# Patient Record
Sex: Male | Born: 1960 | Race: White | Hispanic: Yes | Smoking: Never smoker
Health system: Southern US, Community
[De-identification: ages and names within clinical notes are randomized; demographics above are authoritative.]

## PROBLEM LIST (undated history)

## (undated) DIAGNOSIS — I472 Ventricular tachycardia: Secondary | ICD-10-CM

## (undated) DIAGNOSIS — I10 Essential (primary) hypertension: Secondary | ICD-10-CM

## (undated) DIAGNOSIS — Z952 Presence of prosthetic heart valve: Secondary | ICD-10-CM

## (undated) DIAGNOSIS — Z9889 Other specified postprocedural states: Secondary | ICD-10-CM

## (undated) DIAGNOSIS — I359 Nonrheumatic aortic valve disorder, unspecified: Secondary | ICD-10-CM

## (undated) DIAGNOSIS — K635 Polyp of colon: Secondary | ICD-10-CM

## (undated) DIAGNOSIS — I38 Endocarditis, valve unspecified: Secondary | ICD-10-CM

## (undated) DIAGNOSIS — K219 Gastro-esophageal reflux disease without esophagitis: Secondary | ICD-10-CM

## (undated) HISTORY — DX: Gastro-esophageal reflux disease without esophagitis: K21.9

## (undated) HISTORY — DX: Nonrheumatic aortic valve disorder, unspecified: I35.9

## (undated) HISTORY — DX: Ventricular tachycardia: I47.2

## (undated) HISTORY — DX: Essential (primary) hypertension: I10

## (undated) HISTORY — DX: Polyp of colon: K63.5

## (undated) HISTORY — DX: Endocarditis, valve unspecified: I38

## (undated) HISTORY — DX: Presence of prosthetic heart valve: Z95.2

## (undated) HISTORY — DX: Other specified postprocedural states: Z98.890

---

## 2001-06-19 ENCOUNTER — Emergency Department (HOSPITAL_COMMUNITY): Admission: EM | Admit: 2001-06-19 | Discharge: 2001-06-19 | Payer: Self-pay | Admitting: Emergency Medicine

## 2005-06-30 ENCOUNTER — Emergency Department (HOSPITAL_COMMUNITY): Admission: EM | Admit: 2005-06-30 | Discharge: 2005-06-30 | Payer: Self-pay | Admitting: Emergency Medicine

## 2005-09-11 DIAGNOSIS — Z952 Presence of prosthetic heart valve: Secondary | ICD-10-CM

## 2005-09-11 HISTORY — PX: AORTIC VALVE REPLACEMENT: SHX41

## 2005-09-11 HISTORY — DX: Presence of prosthetic heart valve: Z95.2

## 2006-01-20 ENCOUNTER — Ambulatory Visit: Payer: Self-pay | Admitting: Internal Medicine

## 2006-01-26 ENCOUNTER — Ambulatory Visit: Payer: Self-pay | Admitting: *Deleted

## 2006-01-26 ENCOUNTER — Ambulatory Visit (HOSPITAL_COMMUNITY): Admission: RE | Admit: 2006-01-26 | Discharge: 2006-01-26 | Payer: Self-pay | Admitting: Family Medicine

## 2006-01-29 ENCOUNTER — Ambulatory Visit: Payer: Self-pay

## 2006-01-30 ENCOUNTER — Ambulatory Visit: Payer: Self-pay | Admitting: *Deleted

## 2006-01-30 ENCOUNTER — Inpatient Hospital Stay (HOSPITAL_COMMUNITY): Admission: AD | Admit: 2006-01-30 | Discharge: 2006-02-11 | Payer: Self-pay | Admitting: *Deleted

## 2006-01-31 ENCOUNTER — Ambulatory Visit: Payer: Self-pay | Admitting: Dentistry

## 2006-01-31 ENCOUNTER — Encounter: Payer: Self-pay | Admitting: Internal Medicine

## 2006-02-01 ENCOUNTER — Encounter (INDEPENDENT_AMBULATORY_CARE_PROVIDER_SITE_OTHER): Payer: Self-pay | Admitting: *Deleted

## 2006-02-06 ENCOUNTER — Ambulatory Visit: Payer: Self-pay | Admitting: Internal Medicine

## 2006-02-06 ENCOUNTER — Encounter (INDEPENDENT_AMBULATORY_CARE_PROVIDER_SITE_OTHER): Payer: Self-pay | Admitting: Specialist

## 2006-02-17 ENCOUNTER — Emergency Department (HOSPITAL_COMMUNITY): Admission: EM | Admit: 2006-02-17 | Discharge: 2006-02-18 | Payer: Self-pay | Admitting: Emergency Medicine

## 2006-02-27 ENCOUNTER — Ambulatory Visit: Payer: Self-pay | Admitting: *Deleted

## 2006-02-28 ENCOUNTER — Ambulatory Visit: Payer: Self-pay | Admitting: Gastroenterology

## 2006-03-01 ENCOUNTER — Ambulatory Visit: Payer: Self-pay | Admitting: Gastroenterology

## 2006-03-15 ENCOUNTER — Emergency Department (HOSPITAL_COMMUNITY): Admission: EM | Admit: 2006-03-15 | Discharge: 2006-03-15 | Payer: Self-pay | Admitting: Emergency Medicine

## 2006-03-17 ENCOUNTER — Emergency Department (HOSPITAL_COMMUNITY): Admission: EM | Admit: 2006-03-17 | Discharge: 2006-03-17 | Payer: Self-pay | Admitting: Emergency Medicine

## 2006-03-29 ENCOUNTER — Ambulatory Visit: Payer: Self-pay | Admitting: *Deleted

## 2006-05-08 ENCOUNTER — Ambulatory Visit: Payer: Self-pay | Admitting: *Deleted

## 2006-05-09 ENCOUNTER — Ambulatory Visit: Payer: Self-pay | Admitting: *Deleted

## 2006-05-10 ENCOUNTER — Encounter: Payer: Self-pay | Admitting: Cardiovascular Disease

## 2006-05-10 ENCOUNTER — Ambulatory Visit: Payer: Self-pay

## 2006-05-10 ENCOUNTER — Ambulatory Visit: Payer: Self-pay | Admitting: *Deleted

## 2006-05-28 ENCOUNTER — Emergency Department (HOSPITAL_COMMUNITY): Admission: EM | Admit: 2006-05-28 | Discharge: 2006-05-28 | Payer: Self-pay | Admitting: Emergency Medicine

## 2006-07-17 ENCOUNTER — Ambulatory Visit: Payer: Self-pay | Admitting: *Deleted

## 2006-07-26 ENCOUNTER — Ambulatory Visit: Payer: Self-pay | Admitting: *Deleted

## 2006-08-15 IMAGING — CR DG CHEST 2V
2 series · 2 of 2 positions shown · non-contrast
Comparison: 02/17/06.

CLINICAL DATA: Aortic valve replacement.  Chest pain and burning.  
 CHEST ? 2 VIEW:

[view not recorded (1 of 2)]
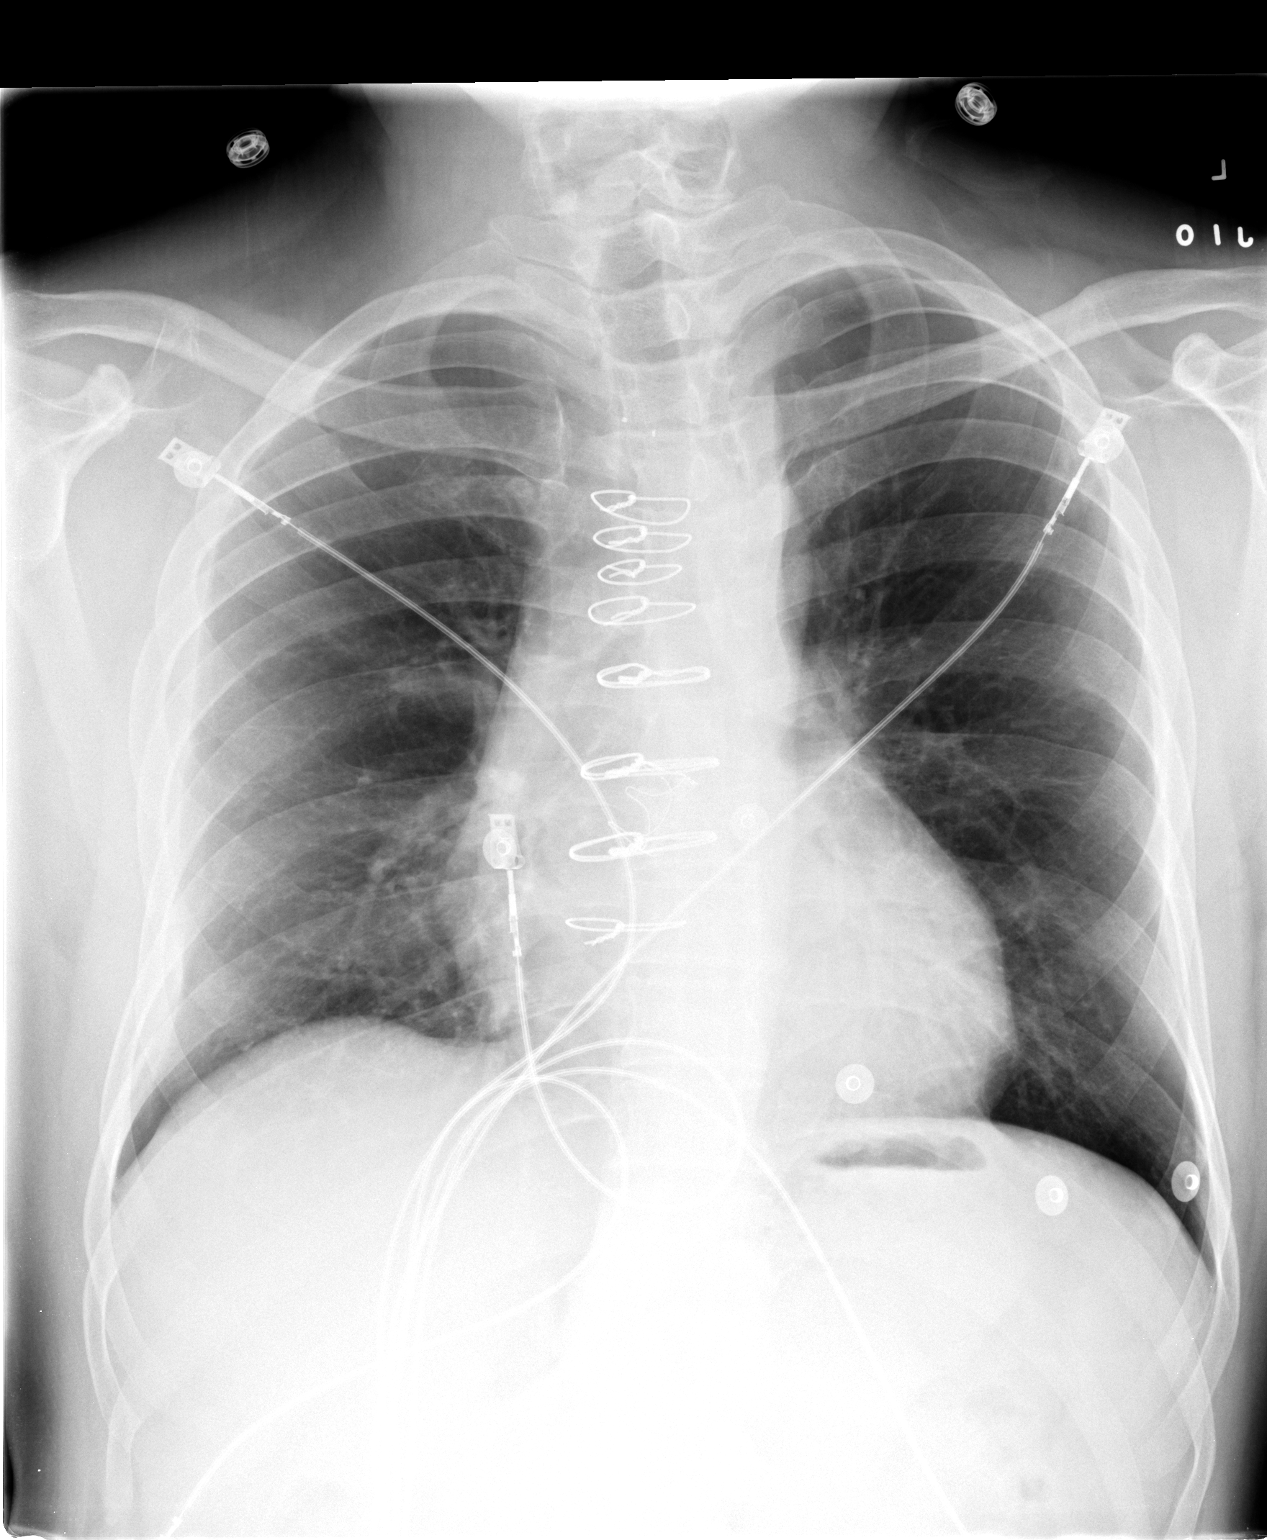

[view not recorded (2 of 2)]
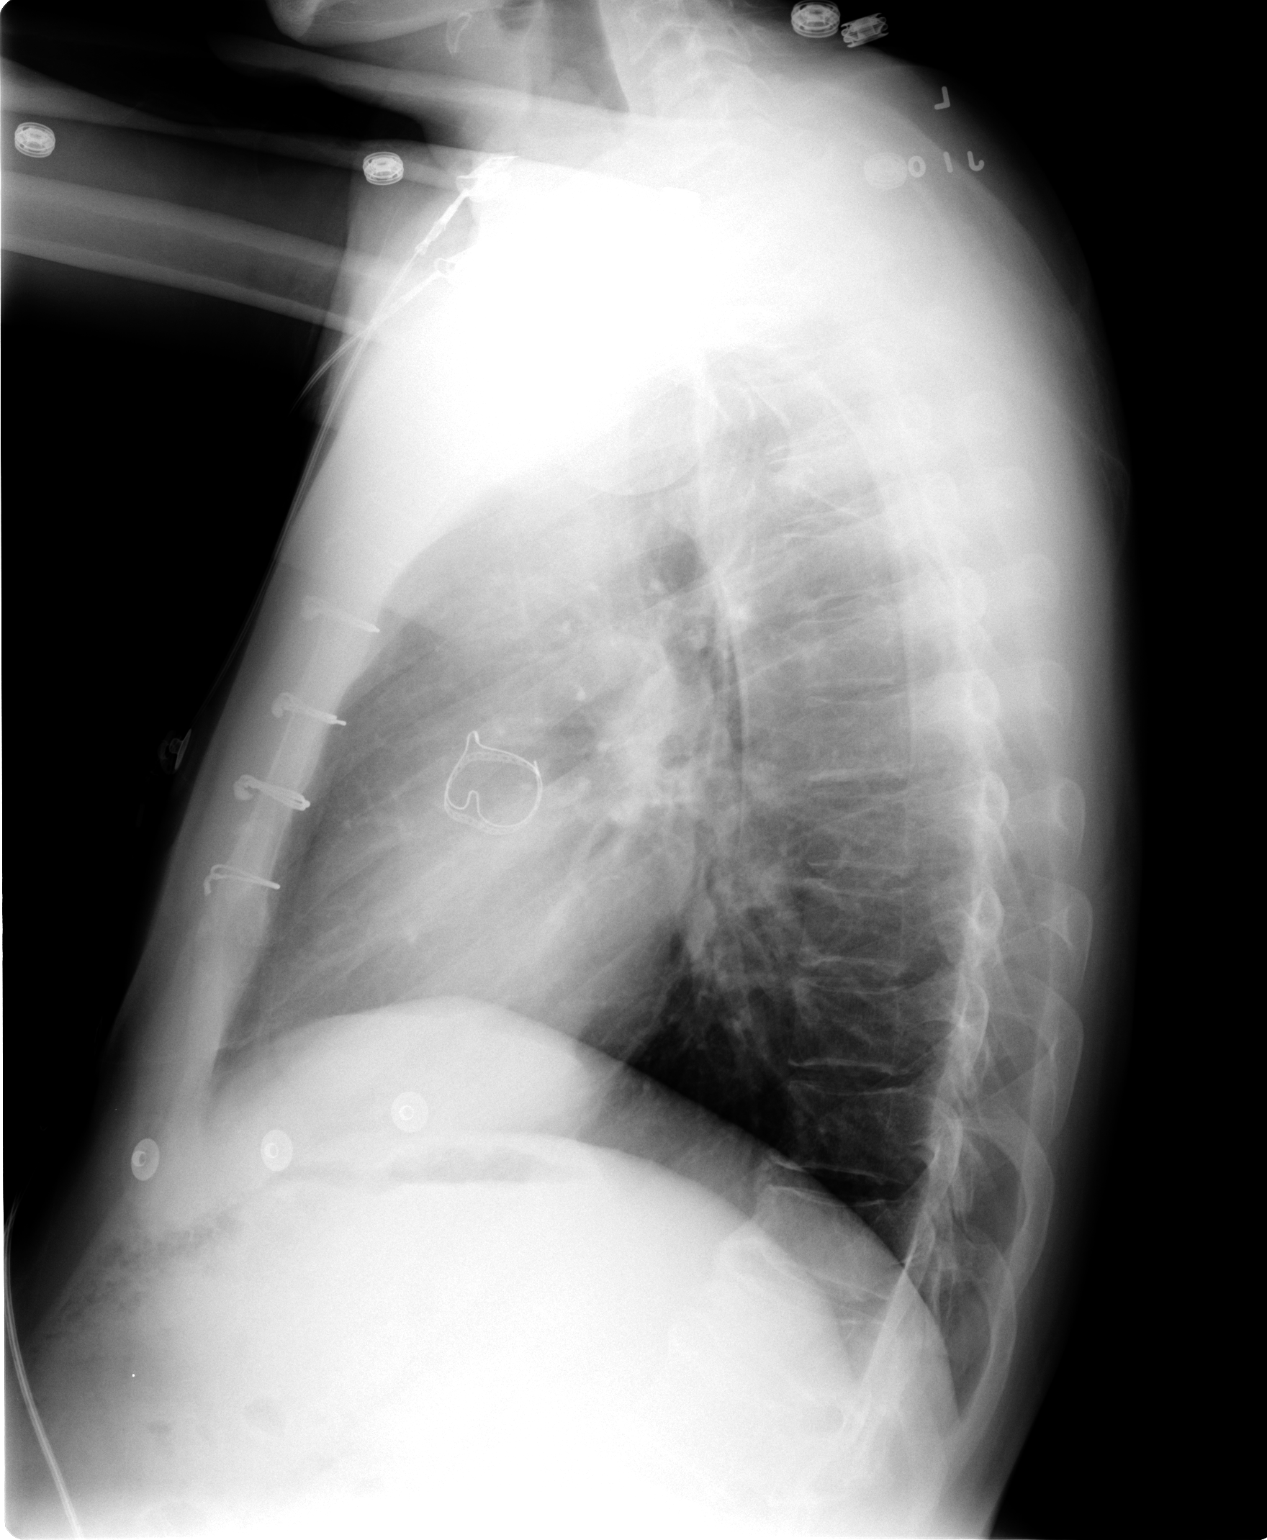

[2 of 2 positions shown; findings below may reference images not displayed]

FINDINGS: The patient has had previous median sternotomy and aortic valve replacement.  Heart size is within normal limits.  The vascularity is normal.  The lungs are clear.  No effusions.  No bony abnormality.
IMPRESSION: No active disease.

## 2006-08-22 ENCOUNTER — Ambulatory Visit: Payer: Self-pay | Admitting: Internal Medicine

## 2006-09-11 DIAGNOSIS — I472 Ventricular tachycardia, unspecified: Secondary | ICD-10-CM

## 2006-09-11 HISTORY — DX: Ventricular tachycardia: I47.2

## 2006-09-11 HISTORY — DX: Ventricular tachycardia, unspecified: I47.20

## 2006-10-01 ENCOUNTER — Ambulatory Visit: Payer: Self-pay | Admitting: *Deleted

## 2006-10-01 LAB — CONVERTED CEMR LAB
BUN: 9 mg/dL (ref 6–23)
CO2: 31 meq/L (ref 19–32)
Calcium: 9.8 mg/dL (ref 8.4–10.5)
Chloride: 102 meq/L (ref 96–112)
Cholesterol: 187 mg/dL (ref 0–200)
Creatinine, Ser: 0.8 mg/dL (ref 0.4–1.5)
GFR calc Af Amer: 135 mL/min
GFR calc non Af Amer: 112 mL/min
Glucose, Bld: 100 mg/dL — ABNORMAL HIGH (ref 70–99)
HDL: 43.9 mg/dL (ref >39.0)
LDL Cholesterol: 118 mg/dL — ABNORMAL HIGH (ref 0–99)
Potassium: 3.8 meq/L (ref 3.5–5.1)
Sodium: 139 meq/L (ref 135–145)
Total CHOL/HDL Ratio: 4.3
Triglycerides: 126 mg/dL (ref 0–149)
VLDL: 25 mg/dL (ref 0–40)

## 2006-10-21 ENCOUNTER — Emergency Department (HOSPITAL_COMMUNITY): Admission: EM | Admit: 2006-10-21 | Discharge: 2006-10-21 | Payer: Self-pay | Admitting: Emergency Medicine

## 2006-12-26 ENCOUNTER — Ambulatory Visit: Payer: Self-pay | Admitting: *Deleted

## 2007-01-11 ENCOUNTER — Ambulatory Visit: Payer: Self-pay | Admitting: Internal Medicine

## 2007-01-11 ENCOUNTER — Ambulatory Visit: Payer: Self-pay | Admitting: *Deleted

## 2007-01-11 LAB — CONVERTED CEMR LAB
BUN: 16 mg/dL (ref 6–23)
CO2: 30 meq/L (ref 19–32)
Calcium: 9.8 mg/dL (ref 8.4–10.5)
Chloride: 106 meq/L (ref 96–112)
Creatinine, Ser: 0.8 mg/dL (ref 0.4–1.5)
GFR calc Af Amer: 135 mL/min
GFR calc non Af Amer: 112 mL/min
Glucose, Bld: 91 mg/dL (ref 70–99)
Potassium: 4.4 meq/L (ref 3.5–5.1)
Sodium: 142 meq/L (ref 135–145)

## 2007-02-08 ENCOUNTER — Ambulatory Visit: Payer: Self-pay | Admitting: *Deleted

## 2007-02-21 ENCOUNTER — Ambulatory Visit: Payer: Self-pay

## 2007-02-21 ENCOUNTER — Encounter (INDEPENDENT_AMBULATORY_CARE_PROVIDER_SITE_OTHER): Payer: Self-pay | Admitting: *Deleted

## 2007-04-25 ENCOUNTER — Ambulatory Visit: Payer: Self-pay | Admitting: Cardiovascular Disease

## 2007-04-25 ENCOUNTER — Ambulatory Visit: Payer: Self-pay

## 2007-04-25 LAB — CONVERTED CEMR LAB
ALT: 35 units/L (ref 0–53)
AST: 31 units/L (ref 0–37)
Albumin: 4.3 g/dL (ref 3.5–5.2)
Alkaline Phosphatase: 75 units/L (ref 39–117)
BUN: 12 mg/dL (ref 6–23)
Bilirubin, Direct: 0.2 mg/dL (ref 0.0–0.3)
CO2: 29 meq/L (ref 19–32)
Calcium: 9.4 mg/dL (ref 8.4–10.5)
Chloride: 106 meq/L (ref 96–112)
Cholesterol: 180 mg/dL (ref 0–200)
Creatinine, Ser: 0.7 mg/dL (ref 0.4–1.5)
GFR calc Af Amer: 157 mL/min
GFR calc non Af Amer: 130 mL/min
Glucose, Bld: 90 mg/dL (ref 70–99)
HDL: 37.4 mg/dL — ABNORMAL LOW (ref >39.0)
Hgb A1c MFr Bld: 5.2 % (ref 4.6–6.0)
LDL Cholesterol: 123 mg/dL — ABNORMAL HIGH (ref 0–99)
Potassium: 4 meq/L (ref 3.5–5.1)
Sodium: 141 meq/L (ref 135–145)
Total Bilirubin: 2.5 mg/dL — ABNORMAL HIGH (ref 0.3–1.2)
Total CHOL/HDL Ratio: 4.8
Total Protein: 7.2 g/dL (ref 6.0–8.3)
Triglycerides: 99 mg/dL (ref 0–149)
VLDL: 20 mg/dL (ref 0–40)

## 2007-10-17 ENCOUNTER — Ambulatory Visit: Payer: Self-pay | Admitting: Cardiovascular Disease

## 2007-12-16 ENCOUNTER — Ambulatory Visit: Payer: Self-pay | Admitting: Cardiovascular Disease

## 2007-12-25 ENCOUNTER — Encounter: Payer: Self-pay | Admitting: Cardiovascular Disease

## 2007-12-25 ENCOUNTER — Ambulatory Visit: Payer: Self-pay

## 2008-02-04 ENCOUNTER — Ambulatory Visit (HOSPITAL_COMMUNITY): Admission: RE | Admit: 2008-02-04 | Discharge: 2008-02-04 | Payer: Self-pay | Admitting: Cardiovascular Disease

## 2008-02-04 ENCOUNTER — Ambulatory Visit: Payer: Self-pay | Admitting: Cardiovascular Disease

## 2008-04-28 ENCOUNTER — Emergency Department (HOSPITAL_COMMUNITY): Admission: EM | Admit: 2008-04-28 | Discharge: 2008-04-28 | Payer: Self-pay | Admitting: Emergency Medicine

## 2008-06-26 ENCOUNTER — Ambulatory Visit: Payer: Self-pay | Admitting: Cardiovascular Disease

## 2008-10-15 ENCOUNTER — Emergency Department (HOSPITAL_COMMUNITY): Admission: EM | Admit: 2008-10-15 | Discharge: 2008-10-15 | Payer: Self-pay | Admitting: Emergency Medicine

## 2008-11-05 ENCOUNTER — Encounter: Payer: Self-pay | Admitting: Cardiovascular Disease

## 2008-11-05 ENCOUNTER — Ambulatory Visit: Payer: Self-pay | Admitting: Cardiovascular Disease

## 2008-11-05 DIAGNOSIS — Z952 Presence of prosthetic heart valve: Secondary | ICD-10-CM | POA: Insufficient documentation

## 2008-11-05 DIAGNOSIS — I38 Endocarditis, valve unspecified: Secondary | ICD-10-CM | POA: Insufficient documentation

## 2008-11-05 DIAGNOSIS — I509 Heart failure, unspecified: Secondary | ICD-10-CM

## 2008-11-05 DIAGNOSIS — I472 Ventricular tachycardia: Secondary | ICD-10-CM

## 2008-11-05 DIAGNOSIS — I10 Essential (primary) hypertension: Secondary | ICD-10-CM | POA: Insufficient documentation

## 2008-11-05 DIAGNOSIS — R079 Chest pain, unspecified: Secondary | ICD-10-CM

## 2008-11-05 DIAGNOSIS — K219 Gastro-esophageal reflux disease without esophagitis: Secondary | ICD-10-CM | POA: Insufficient documentation

## 2008-11-05 DIAGNOSIS — I359 Nonrheumatic aortic valve disorder, unspecified: Secondary | ICD-10-CM | POA: Insufficient documentation

## 2009-04-05 ENCOUNTER — Emergency Department (HOSPITAL_COMMUNITY): Admission: EM | Admit: 2009-04-05 | Discharge: 2009-04-06 | Payer: Self-pay | Admitting: Emergency Medicine

## 2009-05-14 ENCOUNTER — Ambulatory Visit: Payer: Self-pay | Admitting: Cardiovascular Disease

## 2009-06-18 ENCOUNTER — Telehealth (INDEPENDENT_AMBULATORY_CARE_PROVIDER_SITE_OTHER): Payer: Self-pay | Admitting: *Deleted

## 2009-10-05 ENCOUNTER — Ambulatory Visit: Payer: Self-pay | Admitting: Cardiovascular Disease

## 2009-10-05 DIAGNOSIS — R42 Dizziness and giddiness: Secondary | ICD-10-CM | POA: Insufficient documentation

## 2009-10-06 ENCOUNTER — Telehealth: Payer: Self-pay | Admitting: Cardiovascular Disease

## 2009-10-06 ENCOUNTER — Encounter (INDEPENDENT_AMBULATORY_CARE_PROVIDER_SITE_OTHER): Payer: Self-pay | Admitting: *Deleted

## 2009-12-13 ENCOUNTER — Ambulatory Visit (HOSPITAL_COMMUNITY): Admission: RE | Admit: 2009-12-13 | Discharge: 2009-12-13 | Payer: Self-pay | Admitting: Cardiovascular Disease

## 2009-12-13 ENCOUNTER — Ambulatory Visit: Payer: Self-pay | Admitting: Internal Medicine

## 2009-12-13 ENCOUNTER — Ambulatory Visit: Payer: Self-pay

## 2009-12-13 ENCOUNTER — Encounter: Payer: Self-pay | Admitting: Cardiovascular Disease

## 2009-12-14 ENCOUNTER — Telehealth (INDEPENDENT_AMBULATORY_CARE_PROVIDER_SITE_OTHER): Payer: Self-pay | Admitting: *Deleted

## 2010-02-15 ENCOUNTER — Emergency Department (HOSPITAL_COMMUNITY): Admission: EM | Admit: 2010-02-15 | Discharge: 2010-02-16 | Payer: Self-pay | Admitting: Emergency Medicine

## 2010-02-17 ENCOUNTER — Telehealth: Payer: Self-pay | Admitting: Cardiovascular Disease

## 2010-03-11 ENCOUNTER — Ambulatory Visit: Payer: Self-pay | Admitting: Cardiovascular Disease

## 2010-04-24 ENCOUNTER — Emergency Department (HOSPITAL_COMMUNITY): Admission: EM | Admit: 2010-04-24 | Discharge: 2010-04-24 | Payer: Self-pay | Admitting: Emergency Medicine

## 2010-06-22 ENCOUNTER — Emergency Department (HOSPITAL_COMMUNITY): Admission: EM | Admit: 2010-06-22 | Discharge: 2010-06-22 | Payer: Self-pay | Admitting: Emergency Medicine

## 2010-08-06 ENCOUNTER — Emergency Department (HOSPITAL_COMMUNITY): Admission: EM | Admit: 2010-08-06 | Discharge: 2010-08-06 | Payer: Self-pay | Admitting: Emergency Medicine

## 2010-09-15 ENCOUNTER — Encounter: Payer: Self-pay | Admitting: Cardiovascular Disease

## 2010-09-15 ENCOUNTER — Ambulatory Visit
Admission: RE | Admit: 2010-09-15 | Discharge: 2010-09-15 | Payer: Self-pay | Source: Home / Self Care | Attending: Cardiovascular Disease | Admitting: Cardiovascular Disease

## 2010-10-11 NOTE — Progress Notes (Signed)
  Recieved Request from [facility] forwarded to [place].[name]  December 14, 2009 2:25 PM

## 2010-10-11 NOTE — Letter (Signed)
Summary: Return To Work  [employer], Main Office  [address]   [place], [place] [address]   Phone: [phone]  Fax: [phone]    10/06/2009  TO: Leodis Sias IT MAY CONCERN   RE: [name] [address] [id number]   The above named individual is under my medical care and may return to work on: UNTIL HE SEES NEUROLOGIST APPT PENIDNG   If you have any further questions or need additional information, please call.     Sincerely,    DR COOPER/Daemian Gahm, LPN

## 2010-10-11 NOTE — Progress Notes (Signed)
Summary: need letter fax about returning to work.  Phone Note Call from Patient Call back at Home Phone [phone]   Caller: Patient Summary of Call: Pt calling regarding getting letter faxed saying when the pt can return back to work fax to social services [phone] Initial call taken by: Judie Grieve,  October 06, 2009 11:35 AM  Follow-up for Phone Call        PER DR Cherae Marton NOT ABLE TO RETURN TO WORK  UNTIL HE SEES NEUROLOGIST APPT PENDING  Follow-up by: Scherrie Bateman, LPN,  October 06, 2009 12:03 PM  Additional Follow-up for Phone Call Additional follow up Details #1::        Pt very symptomatic with dizziness. Needs to see neurologist before returning to work. Additional Follow-up by: Norva Karvonen, MD,  October 07, 2009 5:40 PM

## 2010-10-11 NOTE — Assessment & Plan Note (Signed)
Summary: ROV   Visit Type:  Follow-up Primary Provider:  Dr Uvaldo Rising  CC:  Legs and arms weakness- Left shoulder and back pain- chest pains- dizy.  History of Present Illness: the patient is a 50 year old gentleman with a history of bicuspid aortic valve who underwent bioprosthetic AVR in 2007 after he developed endocarditis. He has had multiple symptomatic complaints since surgery and has undergone extensive evaluation for chest pain including echocardiography, myocardial perfusion scanning, and coronary angiography. His cardiac catheterization in May 2009 demonstrated essentially normal coronary arteries.  He was again evaluated in the Emergency Dept at [facility] for chest pain in December 2010 and was told not to return to work until further evaluation. The patient continues to have episodic, highly atypical chest pain. He has focal, sharp chest pains in his left chest. These are nonexertional. He also has problems with lightheadedness/dizziness. These symptoms are unrelated to positional changes, and have abrupt onset. He has also been evaluated for this in the past with outpatient monitoring which has been unrevealing.   Current Medications (verified): 1)  Aspirin 81 Mg Tbec (Aspirin) .... Take One Tablet By Mouth Daily 2)  Multivitamins  Tabs (Multiple Vitamin) .... Take 1 Tablet By Mouth Once A Day 3)  Caltrate 600 1500 Mg Tabs (Calcium Carbonate) .... Take 1 Tablet By Mouth Once A Day 4)  Metamucil 0.52 Gm Caps (Psyllium) .... Take 2 Capsules Once Daily 5)  Metoprolol Tartrate 25 Mg Tabs (Metoprolol Tartrate) .... 1/2 Tablet Am Ans 1 Tablet Pm 6)  Fish Oil 1000 Mg Caps (Omega-3 Fatty Acids) .... Take 2 Capsules By Mouth Once Daily 7)  Lisinopril 10 Mg Tabs (Lisinopril) .... Take One Tablet By Mouth Daily 8)  Niacin 100 Mg Tabs (Niacin) .... Take 1 Tablet By Mouth Once A Day 9)  Selenium 200 Mcg Caps (Selenium) .... Take 1 Tablet By Mouth Once A Day 10)  Amoxicillin 500 Mg Tabs  (Amoxicillin) .... Take 4 Tablets By Mouth 1 Hour Prior To Dental Work As Needed 11)  Nitroglycerin 0.4 Mg Subl (Nitroglycerin) .... One Tablet Under Tongue Every 5 Minutes As Needed For Chest Pain---May Repeat Times Three 12)  Cyclobenzaprine Hcl 10 Mg Tabs (Cyclobenzaprine Hcl) .... Take 1 Tablet By Mouth Three Times A Day 13)  Vitamin C 500 Mg  Tabs (Ascorbic Acid) .... Take 1 Tablet By Mouth Once A Day 14)  Garlic Oil 1000 Mg Caps (Garlic) .... Take 1 Capsule By Mouth Once A Day 15)  Clonazepam 0.5 Mg Tabs (Clonazepam) .... Take 1 Tablet By Mouth Once A Day At Bedtime 16)  Omeprazole 20 Mg Tbec (Omeprazole) .... Take 1 Tablet By Mouth Once A Day 17)  Neurontin 300 Mg Caps (Gabapentin) .... 2-3 X A Day 18)  Tramadol Hcl 50 Mg Tabs (Tramadol Hcl) .... As Needed 19)  Loratadine 10 Mg Tabs (Loratadine) .... Take 1 Tablet By Mouth Once A Day 20)  Fluticasone Propionate 50 Mcg/act Susp (Fluticasone Propionate) .... 2 Sprays Each Nostril 21)  Milk Thistle 500 Mg Caps (Milk Thistle) .... Take 1 Capsule By Mouth Two Times A Day  Allergies: 1)  ! Ibuprofen 2)  ! Hydrocodone 3)  ! * Acetaminophen  Past History:  Past medical history reviewed for relevance to current acute and chronic problems.  Past Medical History: Reviewed history from 11/05/2008 and no changes required. VENTRICULAR TACHYCARDIA (ICD-427.1) ENDOCARDITIS (ICD-424.90) AORTIC VALVE DISORDERS (ICD-424.1) CONGESTIVE HEART FAILURE UNSPECIFIED (ICD-428.0) AORTIC VALVE REPLACEMENT, HX OF (ICD-V43.3) GERD (ICD-530.81) CAD, ARTERY BYPASS GRAFT (ICD-414.04)  HYPERTENSION, BENIGN (ICD-401.1)  Review of Systems       Positive for back pain, leg pain, palpitations, dyspnea, headache, and otherwise negative except as per HPI.  Vital Signs:  Patient profile:   50 year old male Height:      63 inches Weight:      155.75 pounds BMI:     27.69 Pulse rate:   65 / minute Pulse rhythm:   regular Resp:     18 per minute BP sitting:    94 / 70  (left arm) Cuff size:   large  Vitals Entered By: Vikki Ports (October 05, 2009 9:20 AM)  Physical Exam  General:  Pt is alert and oriented, in no acute distress. HEENT: normal Neck: normal carotid upstrokes without bruits, JVP normal Lungs: CTA CV: RRR with 2/6 systolic murmur at the LSB Abd: soft, NT, positive BS, no bruit, no organomegaly Ext: no clubbing, cyanosis, or edema. peripheral pulses 2+ and equal Skin: warm and dry without rash    EKG  Procedure date:  10/05/2009  Findings:      NSR with RBBB and left axis deviation, unchanged from previous, HR 65 bpm.  Impression & Recommendations:  Problem # 1:  CHEST PAIN-UNSPECIFIED (ICD-786.50) Highly atypical - evaluated by cardiac cath and stress testing in past which have been normal.  His updated medication list for this problem includes:    Aspirin 81 Mg Tbec (Aspirin) .Marland Kitchen... Take one tablet by mouth daily    Metoprolol Tartrate 25 Mg Tabs (Metoprolol tartrate) .Marland Kitchen... 1/2 tablet am ans 1 tablet pm    Lisinopril 10 Mg Tabs (Lisinopril) .Marland Kitchen... Take one tablet by mouth daily    Nitroglycerin 0.4 Mg Subl (Nitroglycerin) ..... One tablet under tongue every 5 minutes as needed for chest pain---may repeat times three  Problem # 2:  AORTIC VALVE DISORDERS (ICD-424.1) Pt is s/p AVR and his exam is stable. Follow up echo has shown appropriate function of his aortic valve bioprosthesis.  His updated medication list for this problem includes:    Metoprolol Tartrate 25 Mg Tabs (Metoprolol tartrate) .Marland Kitchen... 1/2 tablet am ans 1 tablet pm    Lisinopril 10 Mg Tabs (Lisinopril) .Marland Kitchen... Take one tablet by mouth daily    Nitroglycerin 0.4 Mg Subl (Nitroglycerin) ..... One tablet under tongue every 5 minutes as needed for chest pain---may repeat times three  Problem # 3:  HYPERTENSION, BENIGN (ICD-401.1) BP at goal on current Rx.  His updated medication list for this problem includes:    Aspirin 81 Mg Tbec (Aspirin) .Marland Kitchen... Take  one tablet by mouth daily    Metoprolol Tartrate 25 Mg Tabs (Metoprolol tartrate) .Marland Kitchen... 1/2 tablet am ans 1 tablet pm    Lisinopril 10 Mg Tabs (Lisinopril) .Marland Kitchen... Take one tablet by mouth daily  Orders: EKG w/ Interpretation (93000)  BP today: 94/70 Prior BP: 115/78 (05/14/2009)  Labs Reviewed: K+: 4.0 (04/25/2007) Creat: : 0.7 (04/25/2007)   Chol: 180 (04/25/2007)   HDL: 37.4 (04/25/2007)   LDL: 123 (04/25/2007)   TG: 99 (04/25/2007)  Problem # 4:  DIZZINESS (ICD-780.4) Unable to identify a cardiac etiology. I spoke with his primary physician at the [facility] Dept, Dr Uvaldo Rising, who will arrange outpatient neurology evaluation.  Patient Instructions: 1)  Your physician recommends that you continue on your current medications as directed. Please refer to the Current Medication list given to you today. 2)  Your physician wants you to follow-up in:  6 MONTHS.  You will receive a  reminder letter in the mail two months in advance. If you don't receive a letter, please call our office to schedule the follow-up appointment.

## 2010-10-11 NOTE — Miscellaneous (Signed)
Summary: Orders Update  Clinical Lists Changes  Orders: Added new Referral order of Echocardiogram (Echo) - Signed 

## 2010-10-11 NOTE — Assessment & Plan Note (Signed)
Summary: f71m   Visit Type:  6 months follow up Primary Provider:  Dr Uvaldo Rising  CC:  Dizziness-chest pains-sweats without no reason-legs pain.  History of Present Illness: the patient is a 50 year old gentleman with a history of bicuspid aortic valve who underwent bioprosthetic AVR in 2007 after he developed endocarditis. He has had multiple symptomatic complaints since surgery and has undergone extensive evaluation for chest pain including echocardiography, myocardial perfusion scanning, and coronary angiography. His cardiac catheterization in May 2009 demonstrated essentially normal coronary arteries.  He complains of multiple symptoms - continue dizziness - this is frequent and longstanding. Also c/o chest pain - pins and needles sensation. Other complaints include leg pain, back pain, shortness of breath, and anxiety.  Current Medications (verified): 1)  Aspirin 81 Mg Tbec (Aspirin) .... Take One Tablet By Mouth Daily 2)  Multivitamins  Tabs (Multiple Vitamin) .... Take 1 Tablet By Mouth Once A Day 3)  Caltrate 600 1500 Mg Tabs (Calcium Carbonate) .... Take 1 Tablet By Mouth Once A Day 4)  Metamucil 0.52 Gm Caps (Psyllium) .... Take 2 Capsules Once Daily 5)  Metoprolol Tartrate 25 Mg Tabs (Metoprolol Tartrate) .Marland Kitchen.. 1 Tablet Am and 1/2 Tablet Pm 6)  Fish Oil 1000 Mg Caps (Omega-3 Fatty Acids) .... Take 2 Capsules By Mouth Once Daily 7)  Lisinopril 10 Mg Tabs (Lisinopril) .... Take One Tablet By Mouth Daily 8)  Niacin 100 Mg Tabs (Niacin) .... Take 1 Tablet By Mouth Once A Day 9)  Selenium 200 Mcg Caps (Selenium) .... Take 1 Tablet By Mouth Once A Day 10)  Amoxicillin 500 Mg Tabs (Amoxicillin) .... Take 4 Tablets By Mouth 1 Hour Prior To Dental Work As Needed 11)  Nitroglycerin 0.4 Mg Subl (Nitroglycerin) .... One Tablet Under Tongue Every 5 Minutes As Needed For Chest Pain---May Repeat Times Three 12)  Cyclobenzaprine Hcl 10 Mg Tabs (Cyclobenzaprine Hcl) .... As Needed 13)  Garlic Oil 1000  Mg Caps (Garlic) .... Take 1 Capsule By Mouth Once A Day 14)  Clonazepam 0.5 Mg Tabs (Clonazepam) .... Take 1 Tablet By Mouth Once A Day At Bedtime 15)  Omeprazole 20 Mg Tbec (Omeprazole) .... Take 1 Tablet By Mouth Once A Day 16)  Neurontin 300 Mg Caps (Gabapentin) .... As Needed 17)  Tramadol Hcl 50 Mg Tabs (Tramadol Hcl) .... As Needed 18)  Loratadine 10 Mg Tabs (Loratadine) .... Take 1 Tablet By Mouth Once A Day 19)  Fluticasone Propionate 50 Mcg/act Susp (Fluticasone Propionate) .... 2 Sprays Each Nostril 20)  Milk Thistle 500 Mg Caps (Milk Thistle) .... Take 1 Capsule By Mouth Two Times A Day  Allergies: 1)  ! Ibuprofen 2)  ! Hydrocodone 3)  ! * Acetaminophen  Past History:  Past medical history reviewed for relevance to current acute and chronic problems.  Past Medical History: Reviewed history from 11/05/2008 and no changes required. 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 HEENT: normal Neck: normal carotid upstrokes without bruits, JVP normal Lungs: CTA CV: RRR without murmur or gallop Abd: soft, NT, positive BS, no bruit, no organomegaly Ext: no clubbing, cyanosis, or edema. peripheral pulses 2+ and equal Skin: warm and dry without rash    EKG  Procedure date:  03/11/2010  Findings:       NSR, RBBB, HR 62 bpm, unchanged from previous.  Impression & Recommendations:  Problem # 1:  AORTIC VALVE DISORDERS (ICD-424.1) Exam remains stable. f/u 6 months.  His updated medication list for this problem includes:    Metoprolol Succinate 50 Mg Xr24h-tab (Metoprolol succinate) .Marland Kitchen... Take one tablet by mouth daily    Lisinopril 10 Mg Tabs (Lisinopril) .Marland Kitchen... Take one tablet by mouth daily    Nitroglycerin 0.4 Mg Subl (Nitroglycerin) ..... One tablet under tongue every 5 minutes as needed for chest pain---may repeat times three  Problem # 2:  DIZZINESS (ICD-780.4) I don't know the etiology of this. I don't think it is hemodynamically mediated. The patient is undergoing neurologic evaluation. He has multiple somatic complaints without clear-cut explanation - CV workup has been unrevealing.  Orders: EKG w/ Interpretation (93000)  Patient Instructions: 1)  Your physician has recommended you make the following change in your medication: STOP Metoprolol Tartrate, Start Metoprolol Succinate 50mg  one tablet at bedtime 2)  Your physician wants you to follow-up in: 6 MONTHS.  You will receive a reminder letter in the mail two months in advance. If you don't receive a letter, please call our office to schedule the follow-up appointment. Prescriptions: METOPROLOL SUCCINATE 50 MG XR24H-TAB (METOPROLOL SUCCINATE) Take one tablet by mouth daily  #30 x 11   Entered by:   Julieta Gutting, RN, BSN   Authorized by:   Norva Karvonen, MD   Signed by:   Julieta Gutting, RN, BSN on 03/11/2010   Method used:   Print then Give to Patient   RxID:   [id number]

## 2010-10-11 NOTE — Progress Notes (Signed)
Summary: er/leg/arm weakness  Phone Note Call from Patient Call back at Home Phone [phone]   Caller: Spouse Reason for Call: Talk to Nurse Summary of Call: pt was in the er on 6/7, was told to call and let us know.... having weakness in legs and arms Initial call taken by: Migdalia Dk,  February 17, 2010 10:11 AM  Follow-up for Phone Call        I spoke with the pt's wife and made her aware that the pt does not require follow-up in our office at this time.  The pt had a Dx of dehydration and was given fluids. I told her that the pt needs to remain well hydrated when working outside and continue monitoring his BP at home.  The pt continues to c/o weakness in his legs and arms.  I reviewed the pt's medications and he is not taking a statin.  The pt is scheduled to see a neurologist on 03/23/10 for further evaluation of his symptoms.  Follow-up by: Julieta Gutting, RN, BSN,  February 17, 2010 11:57 AM

## 2010-10-13 NOTE — Assessment & Plan Note (Signed)
Summary: f18m   Visit Type:  6 months follow up Primary Provider:  Dr Uvaldo Rising  CC:  Dizziness- Chest pains.  History of Present Illness: the patient is a 50 year old gentleman with a history of bicuspid aortic valve who underwent bioprosthetic AVR in 2007 after he developed endocarditis. He has had multiple symptomatic complaints since surgery and has undergone extensive evaluation for chest pain including echocardiography, myocardial perfusion scanning, and coronary angiography. His cardiac catheterization in May 2009 demonstrated essentially normal coronary arteries.  Symptoms are unchanged...frequent dizziness, nonpostural is the primary complaint. Also complains of diaphoresis, chest pain, dyspnea, poor sleep.  Current Medications (verified): 1)  Aspirin 81 Mg Tbec (Aspirin) .... Take One Tablet By Mouth Daily 2)  Multivitamins  Tabs (Multiple Vitamin) .... Take 1 Tablet By Mouth Once A Day 3)  Caltrate 600 1500 Mg Tabs (Calcium Carbonate) .... Take 1 Tablet By Mouth Once A Day 4)  Metamucil 0.52 Gm Caps (Psyllium) .... Take 2 Capsules Once Daily 5)  Metoprolol Succinate 50 Mg Xr24h-Tab (Metoprolol Succinate) .... Take One Tablet By Mouth Daily 6)  Fish Oil 1000 Mg Caps (Omega-3 Fatty Acids) .... Take 2 Capsules By Mouth Once Daily 7)  Lisinopril 10 Mg Tabs (Lisinopril) .... Take One Tablet By Mouth Daily 8)  Niacin 100 Mg Tabs (Niacin) .... Take 1 Tablet By Mouth Once A Day 9)  Selenium 200 Mcg Caps (Selenium) .... Take 1 Tablet By Mouth Once A Day 10)  Amoxicillin 500 Mg Tabs (Amoxicillin) .... Take 4 Tablets By Mouth 1 Hour Prior To Dental Work As Needed 11)  Nitroglycerin 0.4 Mg Subl (Nitroglycerin) .... One Tablet Under Tongue Every 5 Minutes As Needed For Chest Pain---May Repeat Times Three 12)  Garlic Oil 1000 Mg Caps (Garlic) .... Take 1 Capsule By Mouth Once A Day 13)  Omeprazole 20 Mg Tbec (Omeprazole) .... Take 1 Tablet By Mouth Once A Day 14)  Neurontin 300 Mg Caps  (Gabapentin) .... As Needed 15)  Tramadol Hcl 50 Mg Tabs (Tramadol Hcl) .... As Needed 16)  Milk Thistle 500 Mg Caps (Milk Thistle) .... Take 1 Capsule By Mouth Two Times A Day  Allergies: 1)  ! Ibuprofen 2)  ! Hydrocodone 3)  ! * Acetaminophen  Past History:  Past medical history reviewed for relevance to current acute and chronic problems.  Past Medical History: Reviewed history from 11/05/2008 and no changes required. 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 HEENT: normal Neck: normal carotid upstrokes without bruits, JVP normal Lungs: CTA CV: RRR without murmur or gallop Abd: soft, NT, positive BS, no bruit, no organomegaly Ext: no clubbing, cyanosis, or edema. peripheral pulses 2+ and equal Skin: warm and dry without rash  EKG  Procedure date:  09/27/2010  Findings:      NSR, RBBB with LAFB  Impression & Recommendations:  Problem # 1:  DIZZINESS (ICD-780.4) Reviewed in detail with the patient. This does not have a postural component. He has been evaluated with extensive cardiac, neurologic, and ENT testing, all have been unrevealing.  Orders: EKG w/ Interpretation (93000)  Problem # 2:  AORTIC VALVE DISORDERS (ICD-424.1) Stable s/p AVR  His updated medication list for this  problem includes:    Metoprolol Succinate 50 Mg Xr24h-tab (Metoprolol succinate) .Marland Kitchen... Take one tablet by mouth daily    Lisinopril 10 Mg Tabs (Lisinopril) .Marland Kitchen... Take one tablet by mouth daily    Nitroglycerin 0.4 Mg Subl (Nitroglycerin) ..... One tablet under tongue every 5 minutes as needed for chest pain---may repeat times three  Orders: EKG w/ Interpretation (93000)  Problem # 3:  HYPERTENSION, BENIGN (ICD-401.1) BP remains controlled.  His updated medication list for this problem includes:    Aspirin 81 Mg Tbec (Aspirin) .Marland Kitchen... Take one tablet by mouth daily    Metoprolol Succinate 50 Mg Xr24h-tab (Metoprolol succinate) .Marland Kitchen... Take one tablet by mouth daily    Lisinopril 10 Mg Tabs (Lisinopril) .Marland Kitchen... Take one tablet by mouth daily  BP today: 104/69 Prior BP: 110/70 (03/11/2010)  Labs Reviewed: K+: 4.0 (04/25/2007) Creat: : 0.7 (04/25/2007)   Chol: 180 (04/25/2007)   HDL: 37.4 (04/25/2007)   LDL: 123 (04/25/2007)   TG: 99 (04/25/2007)  Patient Instructions: 1)  Your physician recommends that you continue on your current medications as directed. Please refer to the Current Medication list given to you today. 2)  Your physician wants you to follow-up in: 6 MONTHS.  You will receive a reminder letter in the mail two months in advance. If you don't receive a letter, please call our office to schedule the follow-up appointment.

## 2010-11-22 LAB — POCT CARDIAC MARKERS

## 2010-11-22 LAB — POCT I-STAT, CHEM 8
BUN: 22 mg/dL (ref 6–23)
Calcium, Ion: 1.14 mmol/L (ref 1.12–1.32)
HCT: 45 % (ref 39.0–52.0)
Hemoglobin: 15.3 g/dL (ref 13.0–17.0)
TCO2: 29 mmol/L (ref 0–100)

## 2010-11-24 LAB — URINALYSIS, ROUTINE W REFLEX MICROSCOPIC
Glucose, UA: NEGATIVE mg/dL
Specific Gravity, Urine: 1.017 (ref 1.005–1.030)
pH: 7 (ref 5.0–8.0)

## 2010-11-24 LAB — CBC
HCT: 44.5 % (ref 39.0–52.0)
MCV: 87.4 fL (ref 78.0–100.0)
RDW: 13.1 % (ref 11.5–15.5)
WBC: 6 10*3/uL (ref 4.0–10.5)

## 2010-11-24 LAB — DIFFERENTIAL
Basophils Absolute: 0 10*3/uL (ref 0.0–0.1)
Eosinophils Relative: 4 % (ref 0–5)
Lymphocytes Relative: 44 % (ref 12–46)
Monocytes Absolute: 0.4 10*3/uL (ref 0.1–1.0)

## 2010-11-24 LAB — URINE CULTURE
Colony Count: NO GROWTH
Culture  Setup Time: 201110121713

## 2010-11-24 LAB — POCT I-STAT, CHEM 8
BUN: 13 mg/dL (ref 6–23)
Calcium, Ion: 1.1 mmol/L — ABNORMAL LOW (ref 1.12–1.32)
Creatinine, Ser: 0.9 mg/dL (ref 0.4–1.5)
TCO2: 26 mmol/L (ref 0–100)

## 2010-11-24 LAB — POCT CARDIAC MARKERS

## 2010-11-28 LAB — POCT I-STAT, CHEM 8
BUN: 9 mg/dL (ref 6–23)
Chloride: 105 mEq/L (ref 96–112)
Creatinine, Ser: 0.7 mg/dL (ref 0.4–1.5)
Sodium: 139 mEq/L (ref 135–145)
TCO2: 27 mmol/L (ref 0–100)

## 2010-11-28 LAB — DIFFERENTIAL
Eosinophils Absolute: 0.1 10*3/uL (ref 0.0–0.7)
Eosinophils Relative: 3 % (ref 0–5)
Lymphocytes Relative: 46 % (ref 12–46)
Lymphs Abs: 2.5 10*3/uL (ref 0.7–4.0)
Monocytes Relative: 9 % (ref 3–12)
Neutrophils Relative %: 42 % — ABNORMAL LOW (ref 43–77)

## 2010-11-28 LAB — URINALYSIS, ROUTINE W REFLEX MICROSCOPIC
Glucose, UA: NEGATIVE mg/dL
Hgb urine dipstick: NEGATIVE
Protein, ur: NEGATIVE mg/dL
pH: 7 (ref 5.0–8.0)

## 2010-11-28 LAB — POCT CARDIAC MARKERS
CKMB, poc: 1.4 ng/mL (ref 1.0–8.0)
Myoglobin, poc: 57.1 ng/mL (ref 12–200)
Myoglobin, poc: 58.7 ng/mL (ref 12–200)
Troponin i, poc: <0.05 ng/mL (ref 0.00–0.09)

## 2010-11-28 LAB — CBC
HCT: 41.9 % (ref 39.0–52.0)
MCV: 90.1 fL (ref 78.0–100.0)
RBC: 4.65 MIL/uL (ref 4.22–5.81)
WBC: 5.3 10*3/uL (ref 4.0–10.5)

## 2010-12-18 LAB — DIFFERENTIAL
Basophils Absolute: 0 10*3/uL (ref 0.0–0.1)
Basophils Relative: 1 % (ref 0–1)
Eosinophils Absolute: 0.2 10*3/uL (ref 0.0–0.7)
Eosinophils Relative: 3 % (ref 0–5)
Neutrophils Relative %: 48 % (ref 43–77)

## 2010-12-18 LAB — POCT I-STAT, CHEM 8
HCT: 44 % (ref 39.0–52.0)
Hemoglobin: 15 g/dL (ref 13.0–17.0)
Potassium: 3.9 mEq/L (ref 3.5–5.1)
Sodium: 136 mEq/L (ref 135–145)
TCO2: 28 mmol/L (ref 0–100)

## 2010-12-18 LAB — CBC
HCT: 43.4 % (ref 39.0–52.0)
MCHC: 34.5 g/dL (ref 30.0–36.0)
MCV: 91.8 fL (ref 78.0–100.0)
Platelets: 198 10*3/uL (ref 150–400)
RDW: 14 % (ref 11.5–15.5)
WBC: 6.6 10*3/uL (ref 4.0–10.5)

## 2010-12-18 LAB — POCT CARDIAC MARKERS
CKMB, poc: <1 ng/mL — ABNORMAL LOW (ref 1.0–8.0)
CKMB, poc: <1 ng/mL — ABNORMAL LOW (ref 1.0–8.0)
Myoglobin, poc: 41.9 ng/mL (ref 12–200)
Myoglobin, poc: 57.1 ng/mL (ref 12–200)

## 2010-12-27 LAB — DIFFERENTIAL
Eosinophils Relative: 3 % (ref 0–5)
Lymphocytes Relative: 36 % (ref 12–46)
Monocytes Absolute: 0.6 10*3/uL (ref 0.1–1.0)
Monocytes Relative: 11 % (ref 3–12)
Neutro Abs: 2.6 10*3/uL (ref 1.7–7.7)

## 2010-12-27 LAB — POCT CARDIAC MARKERS
CKMB, poc: <1 ng/mL — ABNORMAL LOW (ref 1.0–8.0)
Troponin i, poc: 0.07 ng/mL (ref 0.00–0.09)

## 2010-12-27 LAB — BASIC METABOLIC PANEL
CO2: 25 mEq/L (ref 19–32)
Calcium: 9.3 mg/dL (ref 8.4–10.5)
GFR calc Af Amer: >60 mL/min (ref >60)
GFR calc non Af Amer: >60 mL/min (ref >60)
Glucose, Bld: 104 mg/dL — ABNORMAL HIGH (ref 70–99)
Potassium: 3.8 mEq/L (ref 3.5–5.1)
Sodium: 137 mEq/L (ref 135–145)

## 2010-12-27 LAB — CBC
HCT: 42.4 % (ref 39.0–52.0)
Hemoglobin: 15.1 g/dL (ref 13.0–17.0)
RBC: 4.67 MIL/uL (ref 4.22–5.81)
RDW: 13.3 % (ref 11.5–15.5)

## 2011-01-24 NOTE — Assessment & Plan Note (Signed)
[facility]                            CARDIOLOGY OFFICE NOTE   NAME:[name], [name]                  MRN:          [id number]  DATE:10/17/2007                            DOB:          Feb 01, 1961    [name] was seen in follow-up at the [facility] cardiology office  on October 17, 2007.  [name] is a very nice 50 year old male with a  history of bicuspid aortic valve endocarditis who underwent  bioprosthetic AVR back in 2007.  He is currently doing well overall.  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  From time to  time he lifts over 100 pounds.  He continues to have intermittent  dizziness but not very frequently. He has had no loss of consciousness  or frank syncope.  He complains of a pins and  needles chest pain that  occurs on occasion.  His chest pains are fleeting and nonexertional.  He  has no dyspnea, orthopnea, PND or other complaints.   MEDICATIONS:  1. Lisinopril 5 mg daily.  2. Aspirin 81 mg daily.  3. Multivitamin daily.  4. Caltrate 600 mg daily.  5. Metamucil 2 tablets daily.  6. Metoprolol succinate 25 mg daily.  7. Omega III fish oil 2400 mg daily.   ALLERGIES:  None.   PHYSICAL EXAMINATION:  Weight is 157, blood pressure 126/78, heart rate  65.  The patient is alert and oriented.  He is in no acute distress.  HEENT:  Normal.  NECK:  Normal carotid upstrokes without bruits.  Jugular venous pressure  is normal.  LUNGS:  Clear to auscultation bilaterally.  HEART:  Regular rate and rhythm with a 2/6 systolic ejection murmur at  the right upper sternal border. No gallops or diastolic murmurs are  present..  ABDOMEN:  Soft, nontender no organomegaly.  EXTREMITIES:  No clubbing, cyanosis or edema.  Peripheral pulses 2+ and  equal throughout.   EKG shows sinus rhythm with right bundle branch block and a heart rate  of 65 beats per minute.   ASSESSMENT:  1. Bicuspid aortic valve now  status post bioprosthetic AVR. The      patient is doing well.  He should continue on his current      medications.  I asked him to try to limit his extreme heavy      lifting, if he could at least minimize lifting to less than 100      pounds.  [name] has a mildly increased gradient across his      aortic by echo and will have to follow this with serial echoes. His      last echo was in June 2008.  Will repeat at his return visit in      August.  2. Hypertension.  Blood pressure under good control a combination of      metoprolol and lisinopril, both at low doses.  3. For follow-up, I would like to see [name] back in 6 months      with his echocardiogram. Of note, a Holter monitor was performed  after his last visit to evaluate his dizziness and it demonstrated      sinus rhythm with some sinus bradycardia and few PVCs but no      significant arrhythmias.   As above follow-up 6 months.     Veverly Fells. Excell Seltzer, MD  Electronically Signed    MDC/MedQ  DD: 10/17/2007  DT: 10/18/2007  Job #: [id number]

## 2011-01-24 NOTE — Assessment & Plan Note (Signed)
[facility]                            CARDIOLOGY OFFICE NOTE   NAME:Rountree, [name]                  MRN:          [id number]  DATE:04/25/2007                            DOB:          Oct 20, 1960    [name] returned for follow-up at the [facility] Cardiology  office on April 25, 2007.  He is a 50 year old Hispanic male who had a  bicuspid aortic valve.  He developed aortic valve endocarditis and  underwent a bioprosthetic aortic valve replacement approximately one  year ago.  He had done relatively well since his valve replacement.  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  His main complaint is that of intermittent  dizziness.  He had an episode last month where he developed blurry  vision and near-syncope.  He did not have loss of consciousness or frank  syncope.  He has had no other similar episodes.  He does have some  postural dizziness on occasion.  He has not had chest pain, dyspnea,  orthopnea, PND or edema.  He has no other complaints at this time.   CURRENT MEDICATIONS:  1. Metoprolol XL 50 mg daily.  2. Lisinopril 10 mg one-half daily.  3. Aspirin 81 mg daily.  4. Multivitamin daily.  5. Caltrate 600 mg daily.  6. Omega-3 fish oil 1000 mg daily.  7. Metamucil two tablets daily.  8. Garlic.  9. Temazepam 15 mg at bedtime as needed.   ALLERGIES:  No known drug allergies.   PHYSICAL EXAM:  The patient is alert and oriented, in no acute distress.  His weight is 148 pounds.  Blood pressure is 114/80, heart rate is 47,  respiratory rate is 16.  HEENT:  Normal.  NECK:  Normal carotid upstrokes without bruits.  Jugular venous pressure  is normal.  LUNGS:  Clear to auscultation bilaterally.  HEART:  Bradycardic and regular.  There is a 2/6 ejection murmur along  the left sternal border.  There are no diastolic murmurs or gallops.  ABDOMEN:  Soft, nontender, no  organomegaly.  No bruits.  EXTREMITIES:  No clubbing, cyanosis, or edema.  Peripheral pulses are 2+  and equal throughout.   EKG shows marked sinus bradycardia with right bundle branch block.   Echocardiogram from June 12 demonstrated normal left ventricular  systolic function with an LVEF of 55%.  The mean transaortic valve  gradient through the tissue prosthesis was 15 mmHg.  The aortic root was  mildly dilated.  The valve otherwise appeared normal.   ASSESSMENT:  [name] is currently stable from a cardiovascular  standpoint.  His cardiac issues are as follows:   1. Bicuspid aortic valve, history of aortic valve endocarditis, now      status post aortic valve replacement with a tissue valve.  The      patient is asymptomatic with no heart failure or angina.  Since he      has a mildly elevated gradient across his valve, I will plan on      following him with yearly echoes to make  sure that this remains      stable.  Otherwise, we will continue with every 6 month exams and      yearly echocardiograms.  2. Sinus bradycardia with intermittent lightheadedness.  I am      concerned about symptomatic bradycardia.  I am going to decrease      his extended-release metoprolol dose from 50 mg to 25 mg and      perform a Holter monitor to assess his heart rate over a 24-hour      period.  3. Hypertension, well-controlled on a combination of lisinopril and      metoprolol.  Will continue without changes.   For follow-up I would like to see Mr. Difrancesco back in 6 months or sooner  if any new problems arise.     Veverly Fells. Excell Seltzer, MD  Electronically Signed    MDC/MedQ  DD: 04/25/2007  DT: 04/26/2007  Job #: [id number]

## 2011-01-24 NOTE — Assessment & Plan Note (Signed)
[facility]                            CARDIOLOGY OFFICE NOTE   NAME:[name], [name]                  MRN:          [id number]  DATE:02/08/2007                            DOB:          1961-06-11    HISTORY OF PRESENT ILLNESS:  [name] is now a year and 1 day post op  aortic valve replacement for severe regurgitation related to  endocarditis.  A surgery was performed by Dr. Donata Clay using a 23 mm  magnet Edwards pericardial valve.  The patient has done quite well.  In  general other than occasional dizziness he did have an episode of  nonsustained V-tech for which we had him see Dr. Ladona Ridgel, he has never  had syncope and Dr. Ladona Ridgel did not feel that further evaluation was  necessary. In February, he did have low K at 3.8 with PO potassium, he  improved but I am not sure that he has noted any real change in his  symptoms.  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   He is on:  1. Metoprolol XL 50 daily.  2. Lisinopril 5.  3. Aspirin 81.  4. Omega-3.  5. Temazepam 15 nightly.  6. K-Dur 20.   Blood pressure 129/84, pulse 51, sinus bradycardia.  GENERAL:  Appearance normal.  CARDIAC EXAM: VALVE SOUNDS normal. JVP without elevated carotid pulse  PALPABLE AND equal.  Short bruit at the base. __________  LUNGS:  Are clear.  CARDIAC:  Exam is 2/6 short systolic ejection murmur.  No diastolic  murmur.  ABDOMEN:  Exam unremarkable.  EXTREMITIES:  Normal.   Previous EKG revealed sinus rhythm and a right bundle branch block I  believe was noted  post op.   IMPRESSION:  1. One year post aortic valve replacement for severe aortic      regurgitation related to endocarditis.  2. Unsustained V-tech.  3. Question of valve/root mismatch by the echo with  enlargement of      the ascending aorta.   PLAN:  To continue same therapies.  I would discontinue the K-Dur.  We  will get a follow up 2-D echo and I  will have him see Dr. Excell Seltzer for  followup in 3 months.     Clayton Cranker, MD, [facility]  Electronically Signed    EJL/MedQ  DD: 02/08/2007  DT: 02/08/2007  Job #: [id number]   cc:   Alfredia Client, MD

## 2011-01-24 NOTE — Assessment & Plan Note (Signed)
[facility]                            CARDIOLOGY OFFICE NOTE   NAME:[name], [name]                  MRN:          [id number]  DATE:06/26/2008                            DOB:          Oct 18, 1960    [name] was seen in followup at the [facility] Cardiology Office  on June 26, 2008.  He is a 49 year old gentleman with a history of  bicuspid aortic valve who underwent bioprosthetic AVR after developing  endocarditis in 2007.  He has done relatively well ever since his  surgery.  However, he has multiple symptomatic complaints, which  predominately involves dizziness.  He has also had fleeting chest pains  and dyspnea.  His symptoms have been nonexertional.  He is able to  continue his occupation, which involves heavy physical labor.  His  dizziness seems unpredictable, but at times occurs with standing.  However, at other times, it can occur in any position.  He denies  syncope.  He has undergone extensive testing including echocardiography,  cardiac catheterization, nuclear stress studies, and Holter monitoring.  His Holter monitor demonstrated normal sinus rhythm and sinus  bradycardia with occasional PVCs.  He had no significant arrhythmia.  His exercise Myoview scan from April 2009 showed good exercise tolerance  at 9 minutes of exercise and normal myocardial perfusion.  He also had  an echocardiogram in April, which demonstrated normal LV function with a  mean transaortic valve gradient of 12 mmHg, which is normal in this  gentleman with a bioprosthetic AVR.  He ultimately underwent a cardiac  catheterization in May, which showed essentially normal coronary  arteries.  I have a copy of the CT angio report from August 18, after he  presented to the emergency department with chest and back pain.  This  was negative for PE and showed stable postoperative changes.   CURRENT MEDICATIONS:  1. Lisinopril 5 mg daily.  2. Aspirin 81 mg  daily.  3. Multivitamin 1 daily.  4. Caltrate 600 mg daily.  5. Metamucil 2 daily.  6. Garlic 1 daily.  7. Niacin 500 mg daily.  8. Omega-3 fish oil 2 g daily.  9. Metoprolol 25 mg one-half b.i.d.  10.Prevacid 30 mg daily.   PHYSICAL EXAMINATION:  GENERAL:  The patient is alert and oriented.  He  is in no acute distress.  VITAL SIGNS:  Weight is 159 pounds, blood pressure 104/80, heart rate  59, respiratory rate 12.  HEENT:  Normal.  NECK:  Normal carotid upstrokes.  No bruits.  JVP normal.  LUNGS:  Clear bilaterally.  HEART:  Regular rate and rhythm with a 2/6 systolic ejection murmur  along the left sternal border.  ABDOMEN:  Soft, nontender.  No organomegaly.  EXTREMITIES:  No clubbing, cyanosis, or edema.  Pulses 2+ and equal  distally.   ASSESSMENT:  1. Aortic valve disease status post bioprosthetic aortic valve      replacement.  The patient remained stable with normal findings on      his echocardiogram earlier this year.  Continue observation.  2. Dizziness.  I am not sure of the etiology.  It  does not sound like      a hemodynamic phenomenon.  However, since this is his main      complaint, we will hold his ACE inhibitor.  I do not see a clear      indication since he does not have hypertension, diabetes or left      ventricular dysfunction.  We will see if this improves his      symptoms.   For followup, I would like to see [name] back in 6 months.     Veverly Fells. Excell Seltzer, MD  Electronically Signed    MDC/MedQ  DD: 06/29/2008  DT: 06/30/2008  Job #: [id number]

## 2011-01-24 NOTE — Assessment & Plan Note (Signed)
[facility]                            CARDIOLOGY OFFICE NOTE   NAME:[name], [name]                  MRN:          [id number]  DATE:12/16/2007                            DOB:          07/24/1961    [name] was seen in followup at the [facility] Cardiology office  on December 16, 2007.  [name] is a 50 year old gentleman with a  bicuspid aortic valve who underwent bioprosthetic AVR after developing  endocarditis in 2007.  [name] has done well since his surgery.  However, he continues to have multiple symptoms.  At present, he  complains of intermittent dizziness as well as chest pains.  His chest  pains are sharp and located in the substernal region.  They have been  fleeting in most cases.  His symptoms are nonexertional.  He performs  heavy physical labor, and his main complaint with heavy exertion is that  of dizziness.  He also describes exertional dyspnea which is stable over  time.  He has multiple other complaints including left arm pain  radiating to the neck.  At the time of his surgery, he underwent a CT  coronary angiogram where he had a calcium score of 0 with no evidence of  significant coronary artery disease.   MEDICATIONS:  1. Lisinopril 5 mg daily.  2. Aspirin 81 mg daily.  3. Multivitamin daily.  4. Caltrate 600 mg daily.  5. Metamucil.  6. Omega-3.  7. Metoprolol 12.5 mg twice daily.  8. Cyclobenzaprine 5 mg p.r.n.   ALLERGIES:  NKDA.   EXAM:  The patient is alert and oriented.  He is in no acute distress.  Blood pressure was 120/77 with heart rate of 64.  His weight was 157.  Orthostatic vital signs were checked and are as follows:  Supine heart  rate 69, blood pressure 117/78, sitting heart rate 63, blood pressure  120/77.  Standing 2 minute heart rate 64, blood pressure 121/85 with no  symptoms.  HEENT:  Normal.  NECK:  Normal carotid upstrokes without bruits.  Jugulovenous pressure  is normal.  LUNGS:   Clear to auscultation bilaterally.  HEART:  Regular rate and rhythm with 2/6 ejection murmur along the left  sternal border.  No diastolic murmurs or gallops.  ABDOMEN:  Soft, nontender, no organomegaly.  No bruits.  EXTREMITIES:  No clubbing, cyanosis, or edema.  Peripheral pulse 2+ and  equal throughout.  SKIN:  Warm and dry without rash.   Studies reviewed included a Holter monitor that was performed back in  August 2008 for similar symptoms.  This showed normal sinus rhythm with  sinus bradycardia and few PVCs.  The minimum heart rate was 47 beats per  minute with an average of 66 beats per minute.   ASSESSMENT:  This is a 50 year old gentleman with a history of bicuspid  aortic valve with aortic valve endocarditis now status post  bioprosthetic aortic valve replacement.   Multiple symptoms as outlined above.  We will check a 2-D echocardiogram  as well as an exercise Myoview stress scan to rule out significant  ischemia or change in left  ventricular function for an etiology of his  symptoms.   Studies have been performed and on April 15, his echocardiogram showed  normal left ventricular size and function with an left ventricular  ejection fraction of 65%.  There were no regional wall motion  abnormalities.  The aortic valve mean gradient was 12 mmHg which is in  the normal range for a bioprosthetic valve.  There was mild aortic root  dilatation.   Exercise Myoview scan showed a normal  homogenous uptake suggestive of  normal perfusion.  The left ventricular ejection fraction was normal.  He was able to exercise for 9 minutes with no significant ST or T-wave  changes.   Based on the above, [name] appears to be stable from a cardiac  standpoint.  He has had unremarkable Holter monitoring, perfusion stress  test, and 2-D echocardiogram.  It is difficult for me to explain his  symptoms, but I think they are noncardiac in nature.  I would favor  continued careful  observation and clinical followup in 6 months.     Veverly Fells. Excell Seltzer, MD  Electronically Signed    MDC/MedQ  DD: 01/13/2008  DT: 01/13/2008  Job #: [id number]

## 2011-01-27 NOTE — Procedures (Signed)
NAMEISAID, SALVIA NO.:  [id number]   MEDICAL RECORD NO.:  [id number]          PATIENT TYPE:  OUT   LOCATION:  RAD                           FACILITY:  [facility]   PHYSICIAN:  Vida Roller, M.D.   DATE OF BIRTH:  [birth year]   DATE OF PROCEDURE:  01/26/2006  DATE OF DISCHARGE:                                  ECHOCARDIOGRAM   TAPE NUMBER:  [id number].   TAPE COUNT:  [id number].   HISTORY OF PRESENT ILLNESS:  This is a 50 year old man with cardiomegaly and  palpitations. No previous cardiac history.   TECHNICAL QUALITY:  Good.   M-MODE TRACINGS:  The aorta is 40 mm.   Left atrium is 43 mm.   Septum is 15 mm.   Posterior wall is 14 mm.   Left ventricular diastolic dimension is 61 mm.   Left ventricular systolic dimension is 38 mm.   2-D AND DOPPLER IMAGING:  The left ventricle is dilated. There is mildly  depressed LV systolic function with an estimated ejection fraction of 50% to  55%. There is moderate concentric left ventricular hypertrophy. There are no  obvious wall motion abnormalities.   The right ventricle appears to be normal size with normal systolic function.   Both atria are mildly dilated.   The aortic valve is bicuspid and sclerotic. There appears to be a vegetation  on the superior portion of the superior cusp. The vegetation was not  measured but it appears to be large on several views. There is severe aortic  insufficiency seen with mild aortic stenosis. Peak gradient is 39 mmHg.  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 It is not well  interrogated.   The mitral valve is mildly thickened with no vegetation. There is trivial  regurgitation. No stenosis is seen.   The tricuspid valve has mild regurgitation without vegetation.   There is no obvious pericardial effusion.   ASSESSMENT:  This is a markedly abnormal aortic valve with a dilated  aortic  root. There is significant concern for endocarditis here. We will attempt to  contact Dr. Celene Skeen directly, to alert him to this patient's significant  abnormalities.      Vida Roller, M.D.  Electronically Signed     JH/MEDQ  D:  01/26/2006  T:  01/27/2006  Job:  [id number]   cc:   Charlesetta Shanks  Fax: [phone]

## 2011-01-27 NOTE — Letter (Signed)
May 08, 2006     Alfredia Client, MD  [facility]  [address]  [place], [place] [address]   RE:  Clayton, [name]  MRN:  [id number]  /  DOB:  [birth year]   Dear Dr. Morrie Sheldon:   It was a pleasure to see our mutual patient, [name], for followup  on May 08, 2006.  As you know, he is 3 months postop aortic valve  replacement for severe aortic regurgitation, using a 23 mm Magna Edwards  pericardial valve.  The patient has gotten along quite well.  He has  occasional dizziness and occasional palpitations, but no shortness of breath  or chest pain.  He has continued to have occasional rectal bleeding, which  was diagnosed per Dr. Terrial Rhodes as probably internal hemorrhoids.   MEDICATIONS:  1. Aspirin 81 mg.  2. Lisinopril 10 mg.  3. Fish oil.  4. Caltrate.  5. Metoprolol 12.5 mg b.i.d.  6. Metamucil.  7. Suppositories.   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   PHYSICAL EXAMINATION:  VITAL SIGNS:  Reveals a blood pressure of 108/70,  pulse 63, normal sinus rhythm.  GENERAL APPEARANCE:  Normal.  NECK:  JVP is not elevated, carotid pulses are palpable and equal with short  bruit on the left.  LUNGS:  Clear.  CARDIAC:  Exam reveals a 2/6 short systolic ejection murmur, aortic area and  pulmonic area no diastolic murmur.  ABDOMEN:  Exam is normal.  EXTREMITIES:  Normal.   EKG reveals right bundle branch block, unchanged.   DIAGNOSIS:  As above.  The patient is doing quite well.  We did postural  blood pressure check, which revealed no decrease in blood pressure or heart  rate.  I suggested a followup 2D echo, BMP, lipid and LFTs.   We have given him some endocarditis prophylaxis for his dental work to be  done tomorrow.   In general, he is doing well.  I will plan to see him back in 3 months or  p.r.n.  I should note that he does have a widely split second sound, which  goes along with the right bundle branch  block.  Thanks for the opportunity  of following this nice patient with you.  Best regards.    Sincerely,      E. Graceann Congress, MD, [facility]   EJL/MedQ  DD:  05/08/2006  DT:  05/09/2006  Job #:  [id number]

## 2011-01-27 NOTE — Assessment & Plan Note (Signed)
[facility]                         ELECTROPHYSIOLOGY OFFICE NOTE   NAME:[name], [name]                  MRN:          [id number]  DATE:01/11/2007                            DOB:          Jul 26, 1961    [name] returns today for followup.  He is a very pleasant male with  a history of aortic insufficiency, secondary to endocarditis, status  post aortic valve replacement, with a porcine valve placed just over a  year ago.  The patient did have a question of aortic root and valve  mismatch, but since his surgery he has done well.  He did have some  nonsustained VT, but has never had syncope.  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  He has a host of symptoms today.  He notes occasional tingling  sensations in his neck, tingling sensations in his chest, but denies  dyspnea or shortness of breath or chest pressure.   ON EXAM:  He is a pleasant, well-appearing, 50 year old man in no acute  distress.  The blood pressure today was 90/68, the pulse 58 and regular,  respirations were 18, the weight was 150 pounds.  NECK:  Revealed no jugular venous distention.  LUNGS:  Clear bilaterally to auscultation.  No wheezes, rales or  rhonchi.  CARDIOVASCULAR EXAM:  Revealed a regular bradycardia with a split S2.  There were no obvious murmurs.  EXTREMITIES:  Demonstrated no cyanosis, clubbing or edema.  Pulses were  2+ and symmetric.  NEUROLOGIC EXAM:  Alert and oriented times three.   The EKG demonstrated sinus bradycardia with right bundle branch block.   IMPRESSION:  1. Aortic valve insufficiency, secondary to endocarditis.  2. 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  3. Nonsustained VT.   DISCUSSION:  The patient appears to be stable today.  I do not think his  fleeting neck pain is related to his heart or related to VT.  He has had  no syncope and is doing  well with a very vigorous, strenuous job.  I  have asked that he continue on his present medical regimen with beta  blockers and ACE inhibitors.  Should he have any syncope, he is  instructed to go to the emergency room.  Otherwise, I will see him back  on an as-needed basis.     Doylene Canning. Ladona Ridgel, MD  Electronically Signed    GWT/MedQ  DD: 01/11/2007  DT: 01/11/2007  Job #: [id number]

## 2011-01-27 NOTE — Letter (Signed)
July 17, 2006     Dr. Molly Maduro Day  [address]  [place], [place] [address]   RE:  [name], [name]  MRN:  [id number]  /  DOB:  June 12, 1961   Dear Dr. Morrie Sheldon:   It was a pleasure to see this nice patient.  As you know I saw [name]  for followup on July 17, 2006.  He is now more than 5 months post-op  aortic valve replacement for severe aortic regurgitation using a 23 mm Magna  Edwards pericardial valve.   The patient has gotten along quite well, though he continues to have  occasional dizziness, which is not postural.  He has occasional atypical  left chest discomfort.  Otherwise, he is doing quite well.  He is on aspirin  81, multivitamin, metoprolol 12.5 b.i.d., lisinopril 5.   PHYSICAL EXAM:  Blood pressure 117/71, pulse 48, sinus bradycardia.  GENERAL APPEARANCE:  Normal.  JVPs not elevated.  Short bruit right carotid.  LUNGS:  Clear.  CARDIAC:  Normal valve sounds.  No regurgitations.  EXTREMITIES:  Normal.   EKG reveals sinus bradycardia rate of 53.  There is evidence of right bundle  branch block.   IMPRESSION:  1. Postoperative aortic valve replacement with atypical chest pain.  2. Right bundle branch block.  3. Dizziness, questionable etiology.   I have suggested decreasing the metoprolol and changing it to Toprol 12.5  daily.  We will plan to get an event monitor.  I will see him back in 2  months.  We will get a lipid and LFTs at that time.   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   We do plan the event monitor.   A 2D echo of May 10, 2006 revealed an EF of 55%.  Appeared to be some  valve/root mismatch.  I have consulted Dr. Eden Emms and it was felt that we  should follow this up in 1 to 2 years.    Sincerely,     ______________________________  E. Graceann Congress, MD, [facility]    EJL/MedQ  DD: 07/17/2006  DT: 07/17/2006  Job #: [id number]

## 2011-01-27 NOTE — Assessment & Plan Note (Signed)
[facility]                            CARDIOLOGY OFFICE NOTE   NAME:Krouse, SAAGAR TORTORELLA                  MRN:          [id number]  DATE:12/26/2006                            DOB:          03-30-1961    ADDENDUM.   I now have the report of the event monitor from November, it does reveal  there is documentation of nonsustained ventricular tachycardia.   Because of the recurrent dizziness and palpitations, I will ask Dr.  Ladona Ridgel to see him in follow up.  I did not find that he had preoperative  carotid Dopplers, but I think we will wait on these for the time being.     Cecil Cranker, MD, [facility]     EJL/MedQ  DD: 12/26/2006  DT: 12/26/2006  Job #: [id number]   cc:   Dr. Wonda Horner  Dr. Morrie Sheldon

## 2011-01-27 NOTE — Procedures (Signed)
NAMEAVORY, MIMBS NO.:  [id number]   MEDICAL RECORD NO.:  [id number]          PATIENT TYPE:  EMS   LOCATION:  ED                            FACILITY:  [facility]   PHYSICIAN:  Edward L. Juanetta Gosling, M.D.DATE OF BIRTH:  07-18-1961   DATE OF PROCEDURE:  06/30/2005  DATE OF DISCHARGE:  06/30/2005                                EKG INTERPRETATION   EKG NUMBER:  [id number].   The rhythm is sinus rhythm with a rate 50. There is an incomplete right  bundle branch block. There is left atrial enlargement. There are ST-T wave  abnormalities which may indicate ischemia and there is a suggestion of ST  elevation anteriorly. Abnormal electrocardiogram.   Same patient, 8119, June 30, 2005. The rhythm is sinus rhythm with a rate  of 50. There is an incomplete right bundle branch block. There are T-wave  abnormalities which are diffuse and could indicate ischemia. Clinical  correlation is suggested. Abnormal electrocardiogram.      Oneal Deputy. Juanetta Gosling, M.D.  Electronically Signed     ELH/MEDQ  D:  07/01/2005  T:  07/03/2005  Job:  [id number]

## 2011-01-27 NOTE — Consult Note (Signed)
NAMESHYHEEM, WHITHAM NO.:  [id number]   MEDICAL RECORD NO.:  [id number]          PATIENT TYPE:  INP   LOCATION:  2020                         FACILITY:  [facility]   PHYSICIAN:  Charlynne Pander, D.D.S.DATE OF BIRTH:  05-07-1961   DATE OF CONSULTATION:  01/31/2006  DATE OF DISCHARGE:                                   CONSULTATION   DENTAL CONSULTATION:   HISTORY:  [name] is a 50 year old male who was admitted and  recently diagnosed with bacterial endocarditis.  The patient found to have  an aortic valve vegetation with anticipated aortic valve replacement in the  future.  The patient is now seen as part of a pre-heart valve surgery dental  protocol/rule out dental infection which may affect the patient's systemic  health and anticipated heart valve surgery as well as to rule out dental  etiology for the bacterial endocarditis.   MEDICAL HISTORY:  1.  Bacterial endocarditis.      1.  Status post 2-D echocardiogram which revealed aortic valve          vegetation, ejection fraction of 50-55% with moderate left          ventricular hypertrophy and a dilated aortic root.  This was          performed on Jan 26, 2006 with Dr. Dorethea Clan.      2.  Status post transesophageal echocardiogram on December 01, 2005 which          revealed normal left ventricular function with an ejection fraction          of 60-65%, trivial mitral regurgitation, bileaflet aortic valve          along with probable aortic vegetation.  The patient also with          moderate to severe aortic insufficiency and no obvious aortic          stenosis.  2.  Severe aortic insufficiency with anticipated aortic valve replacement      heart surgery pending.  3.  Hypertension.  4.  Alcohol abuse.   ALLERGIES:  NONE KNOWN.   MEDICATIONS:  1.  Aspirin 325 mg daily.  2.  Lisinopril 10 mg twice daily.  3.  Xanax 0.25 mg three times daily as needed.  4.  Vancomycin IV per protocol.  5.  Gentamicin  IV per protocol.   SOCIAL HISTORY:  The patient is married and has five children.  The patient  works in [employer] pl[employer].  The patient is a nonsmoker.  The patient  drinks approximately 3-6 beers daily.   FAMILY HISTORY:  Mother is alive with unknown health status.   FUNCTIONAL ASSESSMENT:  The patient was independent for ADLs prior to this  admission.   REVIEW OF SYSTEMS:  This is reviewed from the chart and health history  assessment form for this admission.   DENTAL HISTORY:   CHIEF COMPLAINT:  Dental consultation requested to rule out dental etiology  for the bacterial endocarditis.   HISTORY OF PRESENT ILLNESS:  The patient recently diagnosed with bacterial  endocarditis with known aortic valve vegetation.  Dental  consultation  requested to rule out dental etiology as well as to evaluate the patient  prior to anticipated aortic valve replacement heart surgery as per protocol.   The patient currently denies acute toothache, swellings or abscesses.  The  patient's last dental treatment was approximately 5-6 years ago when a tooth  was pulled in [place], [place].  The patient denies complications from  that dental extraction.  The patient's wife presents a history of  questionable purulence in the patient's mouth in the morning.  This may be  representative of some sinus problems, however.   DENTAL EXAM:  GENERAL:  The patient is well-developed, well-nourished male  in no acute distress.  VITAL SIGNS:  Blood pressure is 137/74, pulse rate of 68, respirations of  21, temperature is 98.1.  HEAD AND NECK EXAM:  There is no significant lymphadenopathy.  There are no  acute TMJ symptoms by patient report.  INTRAORAL EXAM:  Patient with generally intact dentition and no obvious  abscesses or swellings.  DENTITION:  Patient with a generally intact dentition and appears to be  missing only tooth #32.  PERIODONTAL:  Patient with periodontal disease noted.  We will  review dental  x-rays for the extent of the bone loss.  Periodontal charting was not done  at this time.  DENTAL CARIES:  There are no significant dental caries noted at this time.  I will defer to further evaluation of the dental x-rays to rule out dental  caries.  ENDODONTIC:  The patient currently denies acute pulpitis symptoms.  I will  need to evaluate the dental x-ray to rule out periapical pathology.  CROWN OR BRIDGE:  There are no crown or bridge restorations.  OCCLUSION:  Patient with a stable occlusion at this time.   RADIOGRAPHIC INTERPRETATION:  A panoramic x-ray has been ordered but not  taken at this time.   ASSESSMENTS:  1.  Plaque and calculus accumulations.  2.  No significant tooth mobility noted.  3.  Missing tooth #32.  4.  Generally intact dentition with no obvious dental caries noted.  We will      need to evaluate the dental x-rays further for dental caries and      possible periapical pathology.  5.  Current bacterial endocarditis with unknown etiology.   PLAN AND RECOMMENDATIONS:  I have discussed the risks, benefits and  complications of various treatment options with the patient in relationship  to his medical and dental conditions, current bacterial endocarditis, and  anticipated aortic valve replacement heart surgery.  We discussed obtaining  a Panoramic x-ray and then upon review we will discuss various treatment  options further.  The patient currently does not appear to be suffering from  any dental disease or acute toothaches, but will defer to further evaluation  of the dental x-ray before treatment options are discussed further.  Ideally, the patient could benefit from periodontal therapy at this time but  will discuss with  the cardiovascular thoracic surgeon as indicated prior to the aortic valve  replacement.  In the meantime the patient is to start chlorhexidine rinses  and use the rinse three times daily to aid in disinfection of the  oral cavity at this time.   1.  Discussion of findings with cardiology, infectious disease, and      cardiovascular thoracic surgery as indicated.      Charlynne Pander, D.D.S.  Electronically Signed     RFK/MEDQ  D:  01/31/2006  T:  01/31/2006  Job:  [id number]   cc:   Cecil Cranker, M.D.  [address]  [address]  [place]  [place] [address]

## 2011-01-27 NOTE — Discharge Summary (Signed)
[name], [name] NO.:  [id number]   MEDICAL RECORD NO.:  [id number]          PATIENT TYPE:  INP   LOCATION:  2008                         FACILITY:  [facility]   PHYSICIAN:  [name], M.D.  DATE OF BIRTH:  1961-01-01   DATE OF ADMISSION:  01/30/2006  DATE OF DISCHARGE:                                 DISCHARGE SUMMARY   ADMISSION DIAGNOSIS:  Endocarditis.   PAST MEDICAL HISTORY AND DISCHARGE DIAGNOSES:  1.  Presumed endocarditis, status post treatment with intravenous      vancomycin, gentamicin and Unasyn.  2.  Severe aortic insufficiency with moderate aortic stenosis and a bicuspid      aortic valve, status post aortic valve replacement.  3.  Class III congestive heart failure.   ALLERGIES:  No known drug allergies.   BRIEF HISTORY:  The patient is a 50 year old Hispanic male who presented  with dizziness, weakness and shortness of breath.  A 2-D echo revealed a  badly malformed aortic valve with questionable vegetation and annular  abscess.  He was placed on IV antibiotics including vancomycin and  gentamicin.  His subsequent blood cultures were negative.  A cardiac CT scan  was performed, but this showed no significant coronary disease and no  evidence of annular abscess or endocarditis.  He did have severe aortic  insufficiency with left ventricular dilatation and was felt to be a  candidate for aortic valve replacement.  Secondary to these findings, [name], M.D., of the CVTS service was consulted regarding aortic valve  replacement.   HOSPITAL COURSE:  The patient was admitted on Jan 30, 2006, with complaints  of chest pain, shortness of breath and dizziness.  He was evaluated by  [name] and was noted to have a history significant for one  episode in October of waking up in a cold sweat and having chest pain and  rapid heart beat.  He presented to the emergency room, at which time he was  told there was nothing wrong and  he was sent home.  Since that time he  continued to have chest pain, dizziness and shortness of breath off and on.  The patient was examined and underwent a 2-D echocardiogram, which revealed  aortic insufficiency as well as a badly malformed aortic valve with  questionable vegetation.  He was started on IV vancomycin and gentamicin  subsequent to an infectious disease consultation.  He remained in stable  condition and was evaluated by Dr. Kathlee Nations [name] secondary to the thought  that the patient would require an aortic valve replacement.  Dr. Donata [name]  evaluated the patient on Jan 31, 2006, and it was his opinion that the  patient should proceed with cardiac CT to evaluate his coronaries as well as  the ascending aorta and to rule out annular abscess.  The patient was also  seen in consultation by [name], D.D.S., to rule out dental  source of endocarditis.  It was Dr. Luretha [name] opinion that the patient does  not have a dental source of endocarditis and therefore required no further  management from that  standpoint.   The patient underwent cardiac CT, and this revealed no annular abscess.  His  blood cultures were found to be negative.  He was continued on IV  antibiotics empirically as it was thought that he did not, in fact, have  endocarditis but this could not be completely ruled out.  He was then noted  to have an elevated bilirubin on Feb 06, 2006, for the second day in a row,  and therefore Dr. Juanda [name] was consulted from the GI service.  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  He had otherwise  normal liver functions and no action was required.   The patient was taken to the OR on Feb 06, 2006, for aortic valve  replacement with a 23 mm Magna Edwards pericardial valve.  The patient  tolerated the procedure well and was hemodynamically stable immediately  postoperatively.   The patient was  transferred from the or to the SICU in  stable condition.  The patient was extubated without complication and woke  up from anesthesia neurologically intact.  There was no evidence during the  procedure of endocarditis or vegetations.   The patient's postoperative course progressed as expected.  He was continued  on IV antibiotics in the very early postoperative course, and then they were  discontinued.  On postoperative day 1, the patient was in stable condition  without complaint.  He was afebrile with stable vital signs and maintaining  normal sinus rhythm.  All invasive lines and chest tubes were discontinued  in a routine manner without difficulty.  The patient has not been started on  Coumadin and will not be started on Coumadin secondary to a history of  alcohol use.  He will be maintained on aspirin.   The patient was began cardiac rehab on postoperative day 1 and has increased  his tolerance at a satisfactory level at this time.  On postoperative day 2  the patient has noted nausea with his medications if they are taken without  food.  This was easily remedied.  He was also noted on his chest x-ray on  postoperative day 2 to have some gastric dilatation, and he was therefore  started on Reglan.  All symptoms have subsided at this time.  The patient  has been volume-overloaded postoperatively and has been diuresed  accordingly.   On postoperative day 3, the patient's bowel function has returned.  He is  without complaints.  He is afebrile with stable vital signs and maintaining  a normal sinus rhythm.   PHYSICAL EXAMINATION:  CARDIAC:  Regular rate and rhythm with sharp valve  click.  RESPIRATIONS:  Decreased breath sounds at the bases.  ABDOMEN:  Benign.  EXTREMITIES:  There is no edema present in the bilateral lower extremities.  The patient is in stable condition at this time and as long as he continues  to progress in the current manner, he should be  ready for discharge within  the next 1-2 days pending morning round reevaluation.   LABORATORY DATA:  CBC and BMP on February 09, 2006:  White count 8.8, hemoglobin  10.3, hematocrit 29.4, platelets 140.  Sodium 135, potassium 3.6, BUN 7,  creatinine 0.9, glucose 104.   CONDITION ON DISCHARGE:  Improved.   DISCHARGE INSTRUCTIONS:   MEDICATIONS:  1.  Aspirin 325 mg daily.  2.  Toprol XL 25 mg daily.  3.  Tylox 1-2 every  four to six hours p.r.n. pain.   ACTIVITY:  No driving, no lifting more than 10 pounds x3 weeks, and the  patient should continue daily breathing and walking exercises.   DIET:  Low salt, low fat.   WOUND CARE:  The patient may shower daily and clean the incisions with soap  and water.  If wound problems arise, the patient should contact the CVTS  office at [phone].   Follow-up appointment with Dr. Corinda Gubler.  The [facility] Cardiology service will  be responsible for establishing an appointment for the patient 2 weeks after  discharge, at which time a PA and lateral chest x-ray will be taken.  Next,  Dr. Donata [name] on March 02, 2006, at 1:15 p.m.      Pecola Leisure, [place]      [name], M.D.  Electronically Signed    AY/MEDQ  D:  02/09/2006  T:  02/09/2006  Job:  [id number]   cc:   Cecil Cranker, M.D.  [address]  [address]  [place]  [place] [address]

## 2011-01-27 NOTE — H&P (Signed)
NAME:  [name], [name] NO.:  [id number]   MEDICAL RECORD NO.:  [id number]           PATIENT TYPE:   LOCATION:                                 FACILITY:   PHYSICIAN:  Cecil Cranker, M.D.     DATE OF BIRTH:   DATE OF ADMISSION:  01/30/2006  DATE OF DISCHARGE:                                HISTORY & PHYSICAL   DIAGNOSIS:  Endocarditis.   CHIEF COMPLAINT:  Chest pain, shortness of breath and dizziness.   HISTORY OF PRESENT ILLNESS:  This is a 50 year old [place] male patient who  saw Dr. Celene Skeen Friday complaining of chest pain, dizziness and shortness of  breath.  She heard abnormal heart sounds and ordered an echo, which was read  by Vida Roller, M.D.  This showed bicuspid aortic valve that was  sclerotic with vegetation on the superior portion of the superior cusp.  This vegetation was not measured, but it appears to be large on several  views.  There is severe aortic insufficiency with mild AS.  Peak gradient is  39 mmHg.  There appears to be a perivalvular abscess with color-flow which  moves into the right atrium from one of the coronary cusps.  It is not well-  interrogated.  There is mildly depressed LV systolic function, ejection  fraction 50-55%, and LV is dilated.  Moderate LVH, no obvious wall motion  abnormalities.  He is to be admitted now for endocarditis.   The patient states that back in October he woke up in a cold sweat and was  having chest pain and rapid heart beat.  He went to the emergency room at  that time and was told everything was okay and sent home.  Since then he  continues to have chest pain, dizziness and shortness of breath off and on.  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  He is somewhat of a poor historian and difficult to understand at  times, and most of the history is taken from his wife.   CURRENT MEDICATIONS:  1.  Lisinopril 10 mg to start on Friday.  2.   Multivitamin daily.  3.  Omega-3 fish oil daily.  4.  Calcium daily.   ALLERGIES:  No known drug allergies.   PAST MEDICAL HISTORY:  The patient has never been hospitalized before or  gone to the doctor for anything.  At 51 years old he said he was having chest  pain and some heart problems but never went to the doctor for them.  Does  not know if he had rheumatic fever.   SOCIAL HISTORY:  He works in Transport planner.  He is married.  His  wife has five children.  He has helped raise the two youngest.  He is a  nonsmoker, denies drug use, but he does drink three to six beers daily  during the week and more than this on the weekends.   FAMILY HISTORY:  He has one brother who lives here, five sisters.  He has a  mother and grandparents, but  he does not know any of their health status.   REVIEW OF SYSTEMS:  Significant for dizziness.  He does not wear glasses.  No hearing problems.  No dentures.  CARDIOPULMONARY:  Please see HPI.  He  has no nausea, vomiting, change in bowels or melena.  No nocturia,  incontinence or bladder problems.  No recent change in weight.   PHYSICAL EXAMINATION:  GENERAL:  This is a pleasant 50 year old [place] male  patient in no acute distress.  VITAL SIGNS:  Blood pressure lying down is 160/80, standing 200/90, pulse  65.  HEENT:  Head is normocephalic without sign of trauma.  Extraocular movements  agree intact.  Pupils equal and reactive to light and accommodation.  The  nasal mucosa is moist.  The throat is without erythema or exudate.  NECK:  Without JVD or HJR.  He does have a murmur versus bruit in his brisk  carotids.  LUNGS:  Decreased breath sounds but clear anterior, posterior and lateral.  CARDIAC:  Regular rate and rhythm at 65 beats per minute with a 2/6 systolic  murmur of the right and left sternal border and a 3/6 loud blowing diastolic  murmur at the right and left sternal borders.  ABDOMEN:  Soft without organomegaly, masses,  lesions or abnormal tenderness.  EXTREMITIES:  Without cyanosis, clubbing or edema.  NEUROLOGIC:  Without focal deficit.   EKG:  Normal sinus rhythm, incomplete right bundle branch block, LVH,  nonspecific ST-T wave changes.   IMPRESSION:  1.  Endocarditis with vegetation on bicuspid aortic valve with possible      perivalvular abscess and severe aortic insufficiency and mild aortic      stenosis, peak gradient 39 mmHg.  2.  Hypertension.  3.  Question of viral or bacterial infection back in October 2006.  4.  Alcohol abuse.   PLAN AT THIS TIME:  Will admit this patient to the hospital and have ID see  him.  Will check blood cultures and he will need to start on antibiotics.  He will also have a TEE in the morning to better assess his aortic valve.      Jacolyn Reedy, P.A. [facility]    ______________________________  E. Graceann Congress, M.D.    ML/MEDQ  D:  01/30/2006  T:  01/30/2006  Job:  [id number]

## 2011-01-27 NOTE — Assessment & Plan Note (Signed)
[facility]                            CARDIOLOGY OFFICE NOTE   NAME:[name], Clayton HYNEK                  MRN:          [id number]  DATE:08/08/2006                            DOB:          [birth year]    The patient is five months aortic valve replacement.  He is doing fine,  except for frequent dizziness.  Recording from November 27 on his event  monitor revealed what appears to be ventricular tachycardia at a rate of  170.  I repeated this for Dr. Ladona Ridgel, suggesting increase in the beta  blocker, which we will increase to Toprol XL 50.  I talked with his wife  and I think also I will have him see Dr. Ladona Ridgel in consultation.     Cecil Cranker, MD, [facility]  Electronically Signed    EJL/MedQ  DD: 08/08/2006  DT: 08/08/2006  Job #: [id number]

## 2011-01-27 NOTE — Assessment & Plan Note (Signed)
[facility]                            CARDIOLOGY OFFICE NOTE   NAME:[name], [name]                  MRN:          [id number]  DATE:12/26/2006                            DOB:          Jan 22, 1961    Mr. Savant is eleven months postop aortic valve replacement for severe  aortic regurgitation and endocarditis.The  patient is getting along well  with no chest pain or shortness of breath.  He does have  occasional  palpitations for a few seconds, has occasional dizziness.  He has been  seen by Dr. Lewayne Bunting for EP evaluation.  He did have some  nonsustained V-tach.  At that time,Dr Ladona Ridgel recommended he continue on  beta blockers.  He suggested that, if he did have syncope, he should be  considered for additional EP evaluation and ICD implantation.   MEDICATIONS INCLUDE:  1. Metoprolol XL 50.  2. Lisinopril 5.  3. Aspirin 81.  4. Multivitamin.  5. Caltrate.  6. Omega 3.  7. Metamucil.  8. Garlic.  9. Temazepam 15 h.s.  Apparently, patient sleeps quite poo   Patient was recently seen at [facility] in [place],  [place].  A bruit was noted in the carotids.  Dopplers suggested.  I believe these were done preoperatively.  We plan to seek that report.   I should note recent potassium was 3.6 in February, although he is not  on a diuretic.   PHYSICAL EXAM:  Blood pressure 134/87, pulse 53, sinus bradycardia.  GENERAL APPEARANCE:  Normal.  JVP is not elevated.  Carotid pulses are  palpable and equal with short bruit bilaterally.  LUNGS:  Clear.  CARDIAC EXAM:  2/6 short systolic ejection murmur, aortic area.  No  diastolic murmur.  EXTREMITIES:  Normal.   EKG reveals sinus bradycardia with right bundle branch block.  This is  old.   IMPRESSION:  1. Eleven months post aortic valve replacement for severe aortic      regurgitation, endocarditis.  2. History of nonsustained ventricular tachycardia.  3. Palpitations  and dizziness.  4. Right bundle branch block.  5. Hypokalemia.   I have suggested starting K-Dur 20 a day and recheck the BMP in two  weeks.  If he has further symptoms, may want to repeat the event monitor  again, have him see .  I will see him back in six weeks or p.r.n.   ADDENDUM:  I now have the report of the event monitor from November, it  does reveal there is documentation of nonsustained ventricular  tachycardia.   Because of the recurrent dizziness and palpitations, I will ask Dr.  Ladona Ridgel to see him in follow up.  I did not find that he had preoperative  carotid Dopplers, but I think we will wait on these for the time being.   ADDENDUM:  The patient does have a dilated aortic root and this should  be addressed in the future possibly with a CT angiogram.     E. Graceann Congress, MD, [facility]  Electronically Signed    EJL/MedQ  DD: 12/26/2006  DT: 12/26/2006  Job #:  [id number]   cc:   Wonda Horner, MD  Alfredia Client, MD

## 2011-01-27 NOTE — Assessment & Plan Note (Signed)
[facility]                            CARDIOLOGY OFFICE NOTE   NAME:[name], [name]                  MRN:          [id number]  DATE:12/26/2006                            DOB:          1960/10/21    ADDENDUM:  The patient does have a dilated aortic root and this should  be addressed in the future possibly with a CT angiogram.     E. Graceann Congress, MD, [facility]     EJL/MedQ  DD: 12/26/2006  DT: 12/26/2006  Job #: [id number]

## 2011-01-27 NOTE — Letter (Signed)
October 01, 2006    Alfredia Client, M.D.  [address] [place], [place] [address]   RE:  [name]  MRN:  [id number]  /  DOB:  [birth year]   Dear Dr. Morrie Sheldon,   Your nice patient, [name], was seen in the office on October 01, 2006 for followup.  He is 7 months postop aortic valve replacement  for severe aortic regurgitation using a 23-mm Magna Edwards pericardial  valve.   The patient has gotten along well, though he has occasional dizziness.  He has documented nonsustained ventricular tachycardia and has been  evaluated by Dr. Ladona Ridgel who felt we should continue on beta-blocker and  to do no further EP evaluation unless he is more symptomatic.  The  patient has no chest pain.  The patient has chronic right bundle branch  block, normal LV function.  He is not taking a statin but is trying to  lower his cholesterol with diet.   MEDICATIONS:  He is on metoprolol 50, lisinopril 5, multivitamin,  Caltrate, omega, lorazepam 0.5 p.r.n.   PHYSICAL EXAMINATION:  VITAL SIGNS:  Blood pressure 120/80, pulse normal  sinus rhythm, rare PACs.  GENERAL:  General appearance normal.  NECK:  JVP is elevated.  Carotid pulse evaluated without bruits.  LUNGS:  Clear.  CARDIAC:  Unremarkable.  ABDOMEN:  Unremarkable.  EXTREMITIES:  Normal.   EKG reveals sinus bradycardia, PACs, right bundle branch block.  Dx:as above  I have suggested he continue on the same therapy.  We will be sure that  he is on the longacting metoprolol or Toprol-XL.  He is to return in 3  months.  Will plan a BNP, LFTs.  Certainly, if symptoms progress, he  should be seen by Dr. Ladona Ridgel in followup.    Sincerely,      E. Graceann Congress, MD, [facility]  Electronically Signed    EJL/MedQ  DD: 10/01/2006  DT: 10/01/2006  Job #: [id number]

## 2011-01-27 NOTE — Assessment & Plan Note (Signed)
[facility]                         ELECTROPHYSIOLOGY OFFICE NOTE   NAME:[name], [name]                  MRN:          [id number]  DATE:08/22/2006                            DOB:          [birth year]    Mr. Boldman is referred today by Dr. Glennon Hamilton for evaluation of VT.  The patient is a very pleasant middle-aged man with a history of aortic  valve disease, status post aortic valve replacement performed back in  May.  He has had dizzy spells and palpitations without frank syncope and  underwent cardiac monitoring which demonstrated nonsustained ventricular  tachycardia.  The patient has very few symptoms with this but has never  had frank syncope.  He does feel dizzy and lightheaded.  He denies chest  pain or shortness of breath.  There was a question of aortic root valve  mismatch on 2-D echocardiogram after his heart surgery but there was no  significant valve stenosis following surgery.  He denies heart failure  symptoms.  At the present, he denies peripheral edema.  He denies  shortness of breath.  He denies chest pain.   Additional past medical history is notable for severe aortic  insufficiency with a bypass for the aortic valve and class 3 heart  failure prior to his valve replacement surgery which was carried out  back in May of 2007.  At that time, he had a 22 mm Edwards pericardial  valve placed.  His additional past medical history is notable for  hypertension and remote alcohol abuse.   FAMILY HISTORY:  Notable for a mother dying suddenly in [place].  There  was a question whether she had valvular heart disease as well.   SOCIAL HISTORY:  The patient denies tobacco use.  He does have a history  of alcohol use in the past.   REVIEW OF SYSTEMS:  As noted in the HPI.  Otherwise negative.   PHYSICAL EXAMINATION:  He is a pleasant, middle-aged man in no acute  distress.  The blood pressure was 110/80, the pulse 63 and regular, the  respirations were 18.  The weight was 150 pounds.  The HEENT exam was  normocephalic and atraumatic.  Pupils equal and round.  The oropharynx  moist.  The sclerae were anicteric.  The neck revealed no jugular venous  distention.  There were no thyromegaly.  Trachea was midline.  The  carotids were 2+ and symmetric.  The lungs were clear bilaterally to  auscultation without wheezing, rales or rhonchi.  There is no increase  work of breathing.  The cardiovascular exam revealed a regular rate and  rhythm with a soft systolic murmur at the right upper sternal border.  The PMI was minimally enlarged and displaced.  The abdominal exam was  soft, nontender, and nondistended.  There was no organomegaly.  The  bowel sounds were present.  There was no rebound or guarding.  Extremities demonstrated no clubbing, cyanosis, or edema.  The pulses  were 2+ and symmetric.  Neurologic exam was alert and oriented x3.  Cranial nerves intact.  His strength was 5/5 and symmetric.   Telemetry monitoring demonstrates  ventricular tachycardia, nonsustained  at a rate of 170 beats per minute.  His baseline electrocardiogram  demonstrates sinus rhythm with right bundle branch block.   IMPRESSION:  1. Nonsustained ventricular tachycardia (minimally symptomatic).  2. Status post aortic valve replacement with preserved left      ventricular function.  3. Right bundle branch block.   DISCUSSION:  I have discussed the issues of his nonsustained ventricular  tachycardia with the patient and his significant other, who is with him  today.  Basically, I have recommend that he continue on his beta  blockers.  If he were to have syncope then consideration would be made  for additional EP evaluation and ICD implantation.  At the present time,  however, a period of watchful waiting and medical therapy makes the most  sense.  We will plan to see him back as needed.     Doylene Canning. Ladona Ridgel, MD  Electronically Signed     GWT/MedQ  DD: 08/22/2006  DT: 08/23/2006  Job #: [id number]   cc:   Doctors @ [facility]  E. Graceann Congress, MD, [facility]

## 2011-01-27 NOTE — Op Note (Signed)
NAMEROMAN, DUBUC NO.:  [id number]   MEDICAL RECORD NO.:  [id number]          PATIENT TYPE:  INP   LOCATION:  2302                         FACILITY:  [facility]   PHYSICIAN:  Kerin Perna, M.D.  DATE OF BIRTH:  Nov 17, 1960   DATE OF PROCEDURE:  02/06/2006  DATE OF DISCHARGE:                                 OPERATIVE REPORT   OPERATION:  Aortic valve replacement (23-mm Magna Edwards pericardial valve,  serial number [id number], model 3000).   PREOPERATIVE DIAGNOSIS:  Severe aortic insufficiency, moderate aortic  stenosis, bicuspid aortic valve with Class III congestive heart failure.   POSTOPERATIVE DIAGNOSIS:  Severe aortic insufficiency, moderate aortic  stenosis, bicuspid aortic valve with Class III congestive heart failure.   SURGEON:  Kerin Perna, M.D.   ASSISTANT:  Gershon Crane, P.A.-C.   ANESTHESIA:  General by Dr. Arta Bruce.   INDICATIONS:  The patient is a 50 year old Hispanic male who presented with  dizziness, weakness and shortness of breath.  A 2-D echo showed a badly  malformed aortic valve with questionable vegetation and annular abscess.  He  was placed on antibiotics.  His cultures were negative.  A cardiac CT scan  was performed but showed no significant coronary disease and no evidence of  an annular abscess or endocarditis.  He did have severe aortic insufficiency  with left ventricular dilatation and was felt to be a candidate for aortic  valve replacement.  The patient's past history is significant for alcohol  use and suboptimal medical compliance, and for that reason, a bioprosthetic  valve was felt to be the best choice to avoid long-term Coumadin  requirement.   Prior to surgery, I reviewed results of the 2-D echo and cardiac CT with the  patient and family.  I discussed the indications and expected benefits of  aortic valve replacement for treatment of his aortic insufficiency.  I  reviewed the alternatives to surgical therapy  as well.  I discussed with the  patient major aspects of planned procedure including the location of the  surgical incision, the use of general anesthesia and cardiopulmonary bypass,  the choice of a bioprosthetic valve, and the expected postoperative hospital  recovery.  I reviewed with him the risks to the patient's aortic valve  replacement including risks of MI, CVA, bleeding, blood-transfusion  requirement, infection, and death.  He understood these implications for the  surgery and agreed to proceed with operation as planned under what I felt  was an informed consent.   OPERATIVE FINDINGS:  The aortic valve was extremely dysplastic.  There was  no evidence of endocarditis.  It was bicuspid.  There was redundant annular  tissue in the right coronary sinus and which was plicated with the  subannular valve sutures.  The post pump transesophageal echo showed the 23-  mm Magna to be functioning well without evidence of perivalvular leak.   PROCEDURE:  The patient was brought to operating room and placed supine on  the operating table where general anesthesia was induced.  The chest,  abdomen and legs were prepped Betadine and draped as a sterile field.  A  sternal incision was made.  The sternal retractor was placed, and the  pericardium was opened and suspended.  Heparin was administered.  The  patient was given Amicar and was not given aprotinin for this operation.  Pursestrings were placed in the ascending aorta and right atrium.  The  patient was cannulated and placed on bypass after the ACT was documented as  being therapeutic.  A left ventricular vent was placed via the right  superior pulmonary vein.  Cardioplegia catheters were placed for both  antegrade aortic and retrograde coronary sinus cardioplegia.  The patient  was cooled to 30 degrees.  Aortic crossclamp was applied.  Then, 700 mL of  cold-blood cardioplegia was delivered using the retrograde coronary sinus  catheters.   The patient has severe aortic insufficiency and root-directed  cardioplegia did not maintain an adequate pressure.  The retrograde  cardioplegia resulted in a septal temperature of less than 14 degrees.  Topical iced saline was used to augment myocardial preservation.   The transverse aortotomy was performed.  Aortic valve was inspected was  found to be extremely dysplastic and bicuspid.  There was no evidence of  endocarditis.  The valve was excised.  The annulus was sized to a 25-mm  tissue valve.  The subannular sutures of 2-0 pledgeted Ethibond were placed  around the annulus numbering 16 total.  In the process of placing subannular  sutures, the redundant tissue in the annulus along the right coronary sinus  was plicated.  This downsized the annulus to 23.  For that reason, a 23-mm  Magna valve was selected for the prosthesis.  The sutures were placed  through the sewing ring of the valve, and the valve was seated and sutures  were tied.  There was good fit, and there was no obstruction of the  coronaries.  There was no evidence of spaces between the valve sutures.  The  aortotomy was then closed in two layers using a running 4-0 Prolene.  Prior  to tying down the aortotomy suture, air was vented from the coronaries and  left side of the heart with a dose of retrograde warm blood cardioplegia in  the usual de-airing maneuvers on bypass.  The crossclamp was then removed as  the aortotomy was closed.  The second over-and-over suture line was then  placed to close the aortotomy for hemostasis.   The patient was rewarmed to 37 degrees.  The temperature was rewarmed to 37  degrees, and the temporary pacing wires were applied.  The cardioplegia  catheters and LV vent were removed.  When the patient reached 37 degrees,  the lungs were re-expanded, ventilator was resumed.  The patient was weaned off bypass without difficulty.  Cardiac output and blood pressure were  stable.  He remained in a  sinus rhythm.  The transesophageal echo showed the  prosthetic valve be functioning well, and LV function was good.  Protamine  was administered without adverse reaction.  The cannula was removed, and  mediastinum was irrigated with warm antibiotic irrigation.  The superior  pericardial fat was closed over the aorta, and two chest tubes were placed.  The sternum was then closed with interrupted steel wire.  The pectoralis  fascia was closed using running #1 Vicryl.  The subcutaneous and skin were  closed with a running Vicryl, and sterile dressings were applied.  Total  bypass time was 110 minutes with crossclamp time of 75 minutes.      Kerin Perna, M.D.  Electronically Signed     PV/MEDQ  D:  02/06/2006  T:  02/06/2006  Job:  [id number]

## 2011-01-27 NOTE — Letter (Signed)
March 29, 2006     Dr. Molly Maduro Day  [facility]  [address]  [place], [place]  [address]   RE:  [name]  MRN:  [id number]  /  DOB:  Nov 14, 1960   Dear Dr. Morrie Sheldon,   It was a pleasure to see this patient, [name], for followup on  March 29, 2006.  As you know, he is a very pleasant 50 year old Hispanic male  now six weeks postop aortic valve replacement for severe aortic  regurgitation.  The valve is a 23 mm Magna Edwards pericardial valve.  The  patient tolerated the procedure well.  He had some rectal bleeding postop.  He was seen by Dr. Victorino Dike.  He had a sigmoidoscopy.  It was felt to be  internal hemorrhoids.  Patient still having some bleeding.  Generally,  feeling well.  Has occasional discomfort in his left neck.   No chest pain, shortness of breath, or palpitations.   MEDICATIONS:  1.  Aspirin 81.  2.  Lisinopril 10.  3.  Fish oil.  4.  Metoprolol 12.5 daily.  5.  Metamucil.  6.  Suppositories.   PHYSICAL EXAMINATION:  VITAL SIGNS:  Blood pressure 108/80, pulse 50, normal  sinus rhythm.  GENERAL APPEARANCE:  Normal.  NECK:  JVP is not elevated.  Carotid pulses are palpably equal.  A short  bruit.  LUNGS:  Clear.  CARDIOVASCULAR:  A 1-2/6 short systolic ejection murmur in the aortic area.  No diastolic murmur.  EXTREMITIES:  Normal.   IMPRESSION:  1.  Patient is doing quite well six weeks post aortic valve replacement with      a Magna Edwards pericardial prosthesis.  2.  Rectal bleeding is discussed.  3.  Insomnia.   He had hypertension prior to the surgery, which may in part have been  related to his aortic regurgitation.  Will continue him on his present meds  for now and see him back in six weeks.  We may be able to discontinue his  lisinopril.   Thanks for the opportunity to share in this nice gentleman's care.    Sincerely,      E. Graceann Congress, MD, [facility]   EJL/MedQ  DD:   03/29/2006  DT:  03/29/2006  Job #:  [id number]

## 2011-01-27 NOTE — Op Note (Signed)
NAMEACHILLIES, BUEHL NO.:  [id number]   MEDICAL RECORD NO.:  [id number]          PATIENT TYPE:  INP   LOCATION:  2302                         FACILITY:  [facility]   PHYSICIAN:  Kaylyn Layer. Michelle Piper, M.D.   DATE OF BIRTH:  [birth year]   DATE OF PROCEDURE:  02/06/2006  DATE OF DISCHARGE:                                 OPERATIVE REPORT   PROCEDURE:  Transesophageal echocardiographic probe insertion with  monitoring during aortic valve replacement.   BODY OF REPORT:  I was consulted by Dr. Kathlee Nations Trigt to place the  transesophageal echo probe into Mr. Pho to evaluate and monitor him  during aortic valve replacement.  The patient is a 50 year old gentleman  with a known bicuspid aortic valve who became symptomatic and was brought to  the operating room for an aortic valve replacement.   After an uneventful induction of anesthesia and endotracheal intubation, the  transesophageal echo probe was easily passed into the stomach.  Initial  short axis view of the left ventricle showed good left ventricular wall  motion with no isolated wall motion abnormality.  There was mild left  ventricular hypertrophy.  Turning to the mitral valve, the valve leaflets  corrected normally in all segments, and there was no valve prolapse or  calcification on the valve.  On placing color flow Doppler across the valve,  there was a trace of mitral regurgitation.  The left ventricular outflow  tract showed 3+ aortic insufficiency.  Turning to the left atrium, the left  atrium was normal.  Flow in the pulmonary veins was 40.  Intra-atrial septum  was normal.  Turning to the aortic valve, the aortic valve was bicuspid, and  there was an area of calcification in the lower third of the noncoronary  cusp.  This potentially could have been a vegetation; however, it was a  dense echogenic shadow emanating from this area, which seemed to indicate  that it was calcified.  On placing the color flow  Doppler across the valve,  severe aortic stenosis as well as insufficiency could be seen.  The aortic  insufficiency jet in the longitudinal view passed beyond the anterior  leaflet of the mitral valve.  There was no fluttering of the anterior  leaflet of the mitral valve or paradoxical motion of the anterior leaflet of  the mitral valve.  The annulus of the aortic valve had a concentric secular  dilatation with significant turbulence in it, almost protruding towards the  pulmonary artery; however, there was no ASD or VSDC.  The aortic root  annulus measured 3.4 cm at the level of the saccular dilatation; however, it  was 2.3, measuring with the valves __________.  The rest of the aorta was  normal.  The right side of the heart was normal.  The tricuspid valve was  normal.   The patient was placed on cardiopulmonary bypass by Dr. Donata Clay and  underwent an aortic valve replacement with a #23 valve.  He found that he  had to buttress the area of this whole saccular dilatation inferior to the  valvular annulus.   The  patient was discontinued from cardiopulmonary bypass with minimal  inotrope.  There was minimal intra-cardiac air, and the valve could be seen  to be functioning well.  The left ventricle showed a small cavity but no  wall motion abnormalities.  There was absence of the aortic insufficiency in  the left ventricular outflow tract.  Turning to the prostatic valve, we see  it function normally and in the longitudinally view, there was obvious  absence of the aortic insufficiency, some aortic stenosis, turbulence, and  no paravalvular jets.   At the end of the procedure, transesophageal echo probe was removed from the  patient.  He was taken to the intensive care unit, intubated, and in stable  condition.           ______________________________  Kaylyn Layer Michelle Piper, M.D.     KDO/MEDQ  D:  02/06/2006  T:  02/06/2006  Job:  [id number]

## 2011-02-16 ENCOUNTER — Other Ambulatory Visit: Payer: Self-pay | Admitting: *Deleted

## 2011-02-16 MED ORDER — METOPROLOL SUCCINATE ER 50 MG PO TB24: 50.0000 mg | ORAL_TABLET | Freq: Every day | ORAL | Status: DC

## 2011-02-18 ENCOUNTER — Other Ambulatory Visit: Payer: Self-pay | Admitting: *Deleted

## 2011-02-18 MED ORDER — METOPROLOL SUCCINATE ER 50 MG PO TB24: 50.0000 mg | ORAL_TABLET | Freq: Every day | ORAL | Status: DC

## 2011-02-27 ENCOUNTER — Telehealth: Payer: Self-pay | Admitting: Cardiovascular Disease

## 2011-02-27 NOTE — Telephone Encounter (Signed)
All Cardiac faxed to [facility] [name]  @ [phone]  02/27/11/km

## 2011-03-06 ENCOUNTER — Emergency Department (HOSPITAL_COMMUNITY): Payer: No Typology Code available for payment source

## 2011-03-06 ENCOUNTER — Emergency Department (HOSPITAL_COMMUNITY)
Admission: EM | Admit: 2011-03-06 | Discharge: 2011-03-06 | Disposition: A | Discharge status: Home / Self Care | Payer: No Typology Code available for payment source | Attending: Emergency Medicine | Admitting: Emergency Medicine

## 2011-03-06 DIAGNOSIS — R110 Nausea: Secondary | ICD-10-CM | POA: Insufficient documentation

## 2011-03-06 DIAGNOSIS — M546 Pain in thoracic spine: Secondary | ICD-10-CM | POA: Insufficient documentation

## 2011-03-06 DIAGNOSIS — I2510 Atherosclerotic heart disease of native coronary artery without angina pectoris: Secondary | ICD-10-CM | POA: Insufficient documentation

## 2011-03-06 DIAGNOSIS — K219 Gastro-esophageal reflux disease without esophagitis: Secondary | ICD-10-CM | POA: Insufficient documentation

## 2011-03-06 DIAGNOSIS — IMO0001 Reserved for inherently not codable concepts without codable children: Secondary | ICD-10-CM | POA: Insufficient documentation

## 2011-03-06 DIAGNOSIS — R109 Unspecified abdominal pain: Secondary | ICD-10-CM | POA: Insufficient documentation

## 2011-03-06 DIAGNOSIS — R42 Dizziness and giddiness: Secondary | ICD-10-CM | POA: Insufficient documentation

## 2011-03-06 DIAGNOSIS — R079 Chest pain, unspecified: Secondary | ICD-10-CM | POA: Insufficient documentation

## 2011-03-06 DIAGNOSIS — Z7982 Long term (current) use of aspirin: Secondary | ICD-10-CM | POA: Insufficient documentation

## 2011-03-06 DIAGNOSIS — Z79899 Other long term (current) drug therapy: Secondary | ICD-10-CM | POA: Insufficient documentation

## 2011-03-06 DIAGNOSIS — I10 Essential (primary) hypertension: Secondary | ICD-10-CM | POA: Insufficient documentation

## 2011-03-06 DIAGNOSIS — Z951 Presence of aortocoronary bypass graft: Secondary | ICD-10-CM | POA: Insufficient documentation

## 2011-03-06 LAB — URINALYSIS, ROUTINE W REFLEX MICROSCOPIC
Bilirubin Urine: NEGATIVE
Hgb urine dipstick: NEGATIVE
Nitrite: NEGATIVE
Protein, ur: NEGATIVE mg/dL
Specific Gravity, Urine: 1.022 (ref 1.005–1.030)
Urobilinogen, UA: 0.2 mg/dL (ref 0.0–1.0)

## 2011-03-06 LAB — DIFFERENTIAL
Basophils Absolute: 0 10*3/uL (ref 0.0–0.1)
Eosinophils Relative: 5 % (ref 0–5)
Lymphocytes Relative: 46 % (ref 12–46)
Lymphs Abs: 2.6 10*3/uL (ref 0.7–4.0)
Monocytes Absolute: 0.4 10*3/uL (ref 0.1–1.0)
Neutro Abs: 2.4 10*3/uL (ref 1.7–7.7)

## 2011-03-06 LAB — BASIC METABOLIC PANEL
Calcium: 9.1 mg/dL (ref 8.4–10.5)
GFR calc Af Amer: >60 mL/min (ref >60)
GFR calc non Af Amer: >60 mL/min (ref >60)
Potassium: 3.8 mEq/L (ref 3.5–5.1)
Sodium: 134 mEq/L — ABNORMAL LOW (ref 135–145)

## 2011-03-06 LAB — CBC
HCT: 39.5 % (ref 39.0–52.0)
Hemoglobin: 14.5 g/dL (ref 13.0–17.0)
RBC: 4.69 MIL/uL (ref 4.22–5.81)
WBC: 5.7 10*3/uL (ref 4.0–10.5)

## 2011-03-06 LAB — CK TOTAL AND CKMB (NOT AT ARMC)
CK, MB: 2.5 ng/mL (ref 0.3–4.0)
Relative Index: INVALID (ref 0.0–2.5)
Total CK: 86 U/L (ref 7–232)

## 2011-03-13 ENCOUNTER — Encounter: Payer: Self-pay | Admitting: Cardiovascular Disease

## 2011-03-14 ENCOUNTER — Encounter: Payer: Self-pay | Admitting: Cardiovascular Disease

## 2011-03-14 ENCOUNTER — Ambulatory Visit (INDEPENDENT_AMBULATORY_CARE_PROVIDER_SITE_OTHER): Payer: Self-pay | Admitting: Cardiovascular Disease

## 2011-03-14 VITALS — BP 118/74 | HR 67 | Ht 63.0 in | Wt 160.0 lb

## 2011-03-14 DIAGNOSIS — I359 Nonrheumatic aortic valve disorder, unspecified: Secondary | ICD-10-CM

## 2011-03-14 DIAGNOSIS — F411 Generalized anxiety disorder: Secondary | ICD-10-CM

## 2011-03-14 DIAGNOSIS — R42 Dizziness and giddiness: Secondary | ICD-10-CM

## 2011-03-14 DIAGNOSIS — R079 Chest pain, unspecified: Secondary | ICD-10-CM

## 2011-03-14 NOTE — Patient Instructions (Signed)
Your physician wants you to follow-up in: 6 months with Dr. Cooper.  You will receive a reminder letter in the mail two months in advance. If you don't receive a letter, please call our office to schedule the follow-up appointment.   

## 2011-03-14 NOTE — Assessment & Plan Note (Signed)
Previous evaluation included outpatient telemetry monitoring demonstrating no significant arrhythmia. He has not had orthostasis.

## 2011-03-14 NOTE — Assessment & Plan Note (Signed)
The patient has a chronic chest pain syndrome. He underwent diagnostic catheterization a few years back demonstrating widely patent coronary arteries. The etiology of his chest pain is unknown but he has had extensive cardiovascular evaluation which has been unrevealing. His overall chest pain pattern is unchanged.
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
## 2011-03-14 NOTE — Assessment & Plan Note (Signed)
The patient's echocardiogram from last year was reviewed and it demonstrated a normal functioning aortic valve bioprosthesis. His exam is stable. Continue observation and followup clinically in 6 months.

## 2011-03-14 NOTE — Assessment & Plan Note (Signed)
I believe many of the patient's symptoms are related to an underlying anxiety disorder. This is been untreated because the patient has not had resources to obtain an evaluation. I've asked him to check with the health Department and see what options are available for him to see a psychiatrist or start on medication to treat his generalized anxiety.

## 2011-03-21 ENCOUNTER — Emergency Department (HOSPITAL_COMMUNITY)
Admission: EM | Admit: 2011-03-21 | Discharge: 2011-03-21 | Disposition: A | Discharge status: Home / Self Care | Payer: No Typology Code available for payment source | Attending: Emergency Medicine | Admitting: Emergency Medicine

## 2011-03-21 DIAGNOSIS — R110 Nausea: Secondary | ICD-10-CM | POA: Insufficient documentation

## 2011-03-21 DIAGNOSIS — R61 Generalized hyperhidrosis: Secondary | ICD-10-CM | POA: Insufficient documentation

## 2011-03-21 DIAGNOSIS — I10 Essential (primary) hypertension: Secondary | ICD-10-CM | POA: Insufficient documentation

## 2011-03-21 DIAGNOSIS — M545 Low back pain, unspecified: Secondary | ICD-10-CM | POA: Insufficient documentation

## 2011-03-21 DIAGNOSIS — I2510 Atherosclerotic heart disease of native coronary artery without angina pectoris: Secondary | ICD-10-CM | POA: Insufficient documentation

## 2011-03-21 DIAGNOSIS — H9209 Otalgia, unspecified ear: Secondary | ICD-10-CM | POA: Insufficient documentation

## 2011-03-21 DIAGNOSIS — Z09 Encounter for follow-up examination after completed treatment for conditions other than malignant neoplasm: Secondary | ICD-10-CM | POA: Insufficient documentation

## 2011-03-21 DIAGNOSIS — L298 Other pruritus: Secondary | ICD-10-CM | POA: Insufficient documentation

## 2011-03-21 DIAGNOSIS — Z7982 Long term (current) use of aspirin: Secondary | ICD-10-CM | POA: Insufficient documentation

## 2011-03-21 DIAGNOSIS — L2989 Other pruritus: Secondary | ICD-10-CM | POA: Insufficient documentation

## 2011-03-21 DIAGNOSIS — Z79899 Other long term (current) drug therapy: Secondary | ICD-10-CM | POA: Insufficient documentation

## 2011-03-21 DIAGNOSIS — M542 Cervicalgia: Secondary | ICD-10-CM | POA: Insufficient documentation

## 2011-03-21 DIAGNOSIS — R079 Chest pain, unspecified: Secondary | ICD-10-CM | POA: Insufficient documentation

## 2011-03-21 DIAGNOSIS — R109 Unspecified abdominal pain: Secondary | ICD-10-CM | POA: Insufficient documentation

## 2011-03-21 DIAGNOSIS — K219 Gastro-esophageal reflux disease without esophagitis: Secondary | ICD-10-CM | POA: Insufficient documentation

## 2011-03-21 DIAGNOSIS — R21 Rash and other nonspecific skin eruption: Secondary | ICD-10-CM | POA: Insufficient documentation

## 2011-03-21 DIAGNOSIS — IMO0001 Reserved for inherently not codable concepts without codable children: Secondary | ICD-10-CM | POA: Insufficient documentation

## 2011-03-22 ENCOUNTER — Telehealth: Payer: Self-pay | Admitting: Cardiovascular Disease

## 2011-03-22 NOTE — Telephone Encounter (Signed)
Joy Rn from the health center has question re pt meds. [phone] # [phone]

## 2011-03-22 NOTE — Telephone Encounter (Signed)
Attempted to call Joy. Message states subscriber dialed is not in service.

## 2011-03-23 NOTE — Telephone Encounter (Signed)
Tried again to reach Rhame. Received same message that not in service.

## 2011-03-28 ENCOUNTER — Emergency Department (HOSPITAL_COMMUNITY)
Admission: EM | Admit: 2011-03-28 | Discharge: 2011-03-28 | Disposition: A | Discharge status: Home / Self Care | Payer: No Typology Code available for payment source | Attending: Emergency Medicine | Admitting: Emergency Medicine

## 2011-03-28 DIAGNOSIS — R42 Dizziness and giddiness: Secondary | ICD-10-CM | POA: Insufficient documentation

## 2011-03-28 DIAGNOSIS — M545 Low back pain, unspecified: Secondary | ICD-10-CM | POA: Insufficient documentation

## 2011-03-28 DIAGNOSIS — I4510 Unspecified right bundle-branch block: Secondary | ICD-10-CM | POA: Insufficient documentation

## 2011-03-28 DIAGNOSIS — R51 Headache: Secondary | ICD-10-CM | POA: Insufficient documentation

## 2011-03-28 DIAGNOSIS — I10 Essential (primary) hypertension: Secondary | ICD-10-CM | POA: Insufficient documentation

## 2011-03-28 DIAGNOSIS — IMO0001 Reserved for inherently not codable concepts without codable children: Secondary | ICD-10-CM | POA: Insufficient documentation

## 2011-03-28 DIAGNOSIS — R61 Generalized hyperhidrosis: Secondary | ICD-10-CM | POA: Insufficient documentation

## 2011-03-28 DIAGNOSIS — K219 Gastro-esophageal reflux disease without esophagitis: Secondary | ICD-10-CM | POA: Insufficient documentation

## 2011-03-28 DIAGNOSIS — Z954 Presence of other heart-valve replacement: Secondary | ICD-10-CM | POA: Insufficient documentation

## 2011-03-28 DIAGNOSIS — R079 Chest pain, unspecified: Secondary | ICD-10-CM | POA: Insufficient documentation

## 2011-03-28 DIAGNOSIS — I2510 Atherosclerotic heart disease of native coronary artery without angina pectoris: Secondary | ICD-10-CM | POA: Insufficient documentation

## 2011-03-28 DIAGNOSIS — M542 Cervicalgia: Secondary | ICD-10-CM | POA: Insufficient documentation

## 2011-04-24 NOTE — Telephone Encounter (Signed)
Tried again to reach [name] and received same message that number not in service.  No indication of which health center call was from.

## 2011-05-02 ENCOUNTER — Emergency Department (HOSPITAL_COMMUNITY)
Admission: EM | Admit: 2011-05-02 | Discharge: 2011-05-02 | Disposition: A | Discharge status: Home / Self Care | Payer: No Typology Code available for payment source | Attending: Emergency Medicine | Admitting: Emergency Medicine

## 2011-05-02 DIAGNOSIS — I10 Essential (primary) hypertension: Secondary | ICD-10-CM | POA: Insufficient documentation

## 2011-05-02 DIAGNOSIS — M542 Cervicalgia: Secondary | ICD-10-CM | POA: Insufficient documentation

## 2011-05-02 DIAGNOSIS — R079 Chest pain, unspecified: Secondary | ICD-10-CM | POA: Insufficient documentation

## 2011-05-02 DIAGNOSIS — I2510 Atherosclerotic heart disease of native coronary artery without angina pectoris: Secondary | ICD-10-CM | POA: Insufficient documentation

## 2011-05-02 DIAGNOSIS — R51 Headache: Secondary | ICD-10-CM | POA: Insufficient documentation

## 2011-05-02 DIAGNOSIS — K219 Gastro-esophageal reflux disease without esophagitis: Secondary | ICD-10-CM | POA: Insufficient documentation

## 2011-05-02 DIAGNOSIS — M549 Dorsalgia, unspecified: Secondary | ICD-10-CM | POA: Insufficient documentation

## 2011-06-07 LAB — BASIC METABOLIC PANEL
CO2: 27
Calcium: 9.3
Creatinine, Ser: 0.69
GFR calc Af Amer: >60
GFR calc non Af Amer: >60
Glucose, Bld: 105 — ABNORMAL HIGH
Sodium: 137

## 2011-06-07 LAB — CBC
Hemoglobin: 15.7
MCHC: 34.9
RDW: 13.5

## 2011-06-07 LAB — PROTIME-INR
INR: 1
Prothrombin Time: 13.3

## 2011-09-25 ENCOUNTER — Telehealth: Payer: Self-pay | Admitting: Cardiovascular Disease

## 2011-09-25 NOTE — Telephone Encounter (Signed)
I spoke with the pt's wife and the pt has been having problems with BP running 140/100, dizziness and diaphoresis the past 3-4 days.  I scheduled the pt to be seen on 09/26/11 with Lawson Fiscal NP.  The pt does have a future appointment already scheduled with Dr Excell Seltzer.

## 2011-09-25 NOTE — Telephone Encounter (Signed)
Pt has appt 1-30, the past 3-4 days pt  BP high, sweating, nervous and weak, pls advise

## 2011-09-26 ENCOUNTER — Encounter: Payer: Self-pay | Admitting: Nurse Practitioner

## 2011-09-26 ENCOUNTER — Ambulatory Visit (INDEPENDENT_AMBULATORY_CARE_PROVIDER_SITE_OTHER): Payer: Self-pay | Admitting: Nurse Practitioner

## 2011-09-26 VITALS — BP 110/60 | HR 60 | Ht 64.0 in | Wt 154.0 lb

## 2011-09-26 DIAGNOSIS — I25810 Atherosclerosis of coronary artery bypass graft(s) without angina pectoris: Secondary | ICD-10-CM

## 2011-09-26 DIAGNOSIS — R42 Dizziness and giddiness: Secondary | ICD-10-CM

## 2011-09-26 DIAGNOSIS — R5383 Other fatigue: Secondary | ICD-10-CM

## 2011-09-26 DIAGNOSIS — R5381 Other malaise: Secondary | ICD-10-CM

## 2011-09-26 DIAGNOSIS — R6889 Other general symptoms and signs: Secondary | ICD-10-CM

## 2011-09-26 DIAGNOSIS — Z954 Presence of other heart-valve replacement: Secondary | ICD-10-CM

## 2011-09-26 DIAGNOSIS — R69 Illness, unspecified: Secondary | ICD-10-CM

## 2011-09-26 DIAGNOSIS — R002 Palpitations: Secondary | ICD-10-CM

## 2011-09-26 LAB — CBC WITH DIFFERENTIAL/PLATELET
Basophils Absolute: 0 10*3/uL (ref 0.0–0.1)
Basophils Relative: 0.6 % (ref 0.0–3.0)
Eosinophils Absolute: 0.2 10*3/uL (ref 0.0–0.7)
Eosinophils Relative: 3.1 % (ref 0.0–5.0)
HCT: 44.3 % (ref 39.0–52.0)
Hemoglobin: 15.4 g/dL (ref 13.0–17.0)
Lymphocytes Relative: 34.5 % (ref 12.0–46.0)
Lymphs Abs: 2.2 10*3/uL (ref 0.7–4.0)
MCHC: 34.7 g/dL (ref 30.0–36.0)
MCV: 90.3 fl (ref 78.0–100.0)
Monocytes Absolute: 0.6 10*3/uL (ref 0.1–1.0)
Monocytes Relative: 9.4 % (ref 3.0–12.0)
Neutro Abs: 3.3 10*3/uL (ref 1.4–7.7)
Neutrophils Relative %: 52.4 % (ref 43.0–77.0)
Platelets: 202 10*3/uL (ref 150.0–400.0)
RBC: 4.91 Mil/uL (ref 4.22–5.81)
RDW: 13.6 % (ref 11.5–14.6)
WBC: 6.3 10*3/uL (ref 4.5–10.5)

## 2011-09-26 LAB — BASIC METABOLIC PANEL
BUN: 14 mg/dL (ref 6–23)
CO2: 25 mEq/L (ref 19–32)
Calcium: 9.6 mg/dL (ref 8.4–10.5)
Chloride: 106 mEq/L (ref 96–112)
Creatinine, Ser: 0.6 mg/dL (ref 0.4–1.5)
GFR: 161.03 mL/min (ref >60.00)
Glucose, Bld: 97 mg/dL (ref 70–99)
Potassium: 4.3 mEq/L (ref 3.5–5.1)
Sodium: 140 mEq/L (ref 135–145)

## 2011-09-26 LAB — TSH: TSH: 2.57 u[IU]/mL (ref 0.35–5.50)

## 2011-09-26 NOTE — Assessment & Plan Note (Signed)
Will update his echo.

## 2011-09-26 NOTE — Assessment & Plan Note (Signed)
Event monitor is placed today.
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
## 2011-09-26 NOTE — Patient Instructions (Signed)
We will check some labs today.  We are going to update your ultrasound of your heart.  We are going to put a heart monitor on to look at your rhythm for the next month.  Keep your appointment with Dr. Excell Seltzer for later this month.   Call the [facility] office at [phone] if you have any questions, problems or concerns.

## 2011-09-26 NOTE — Assessment & Plan Note (Signed)
No history of known CAD with normal coronaries per cath in 2009 reported with normal EF.

## 2011-09-26 NOTE — Assessment & Plan Note (Signed)
He presents with multiple somatic complaints. He has had a past history of NSTVT. We will check some labs today. Place an event monitor and update his echo. He will keep his appointment with Dr. Excell Seltzer for later this month. His blood pressure is currently ok. I have not changed his medicines. Further disposition to follow. Patient is agreeable to this plan and will call if any problems develop in the interim.

## 2011-09-28 DIAGNOSIS — R002 Palpitations: Secondary | ICD-10-CM

## 2011-10-11 ENCOUNTER — Ambulatory Visit (INDEPENDENT_AMBULATORY_CARE_PROVIDER_SITE_OTHER): Payer: Self-pay | Admitting: Cardiovascular Disease

## 2011-10-11 ENCOUNTER — Encounter: Payer: Self-pay | Admitting: Cardiovascular Disease

## 2011-10-11 ENCOUNTER — Ambulatory Visit (HOSPITAL_COMMUNITY): Payer: Self-pay | Attending: Nurse Practitioner | Admitting: Radiology

## 2011-10-11 VITALS — BP 118/84 | HR 58 | Ht 65.0 in | Wt 158.8 lb

## 2011-10-11 DIAGNOSIS — R5383 Other fatigue: Secondary | ICD-10-CM

## 2011-10-11 DIAGNOSIS — R079 Chest pain, unspecified: Secondary | ICD-10-CM

## 2011-10-11 DIAGNOSIS — I472 Ventricular tachycardia, unspecified: Secondary | ICD-10-CM | POA: Insufficient documentation

## 2011-10-11 DIAGNOSIS — I359 Nonrheumatic aortic valve disorder, unspecified: Secondary | ICD-10-CM | POA: Insufficient documentation

## 2011-10-11 DIAGNOSIS — R002 Palpitations: Secondary | ICD-10-CM

## 2011-10-11 DIAGNOSIS — I4729 Other ventricular tachycardia: Secondary | ICD-10-CM | POA: Insufficient documentation

## 2011-10-11 DIAGNOSIS — R42 Dizziness and giddiness: Secondary | ICD-10-CM | POA: Insufficient documentation

## 2011-10-11 DIAGNOSIS — I509 Heart failure, unspecified: Secondary | ICD-10-CM | POA: Insufficient documentation

## 2011-10-11 MED ORDER — NITROGLYCERIN 0.4 MG SL SUBL: 0.4000 mg | SUBLINGUAL_TABLET | SUBLINGUAL | Status: DC | PRN

## 2011-10-11 NOTE — Patient Instructions (Signed)
Your physician wants you to follow-up in: 12 months.  You will receive a reminder letter in the mail two months in advance. If you don't receive a letter, please call our office to schedule the follow-up appointment.  Your physician recommends that you continue on your current medications as directed. Please refer to the Current Medication list given to you today.   

## 2011-10-15 NOTE — Assessment & Plan Note (Signed)
The patient's aortic valve prosthesis functions normally. He will continue with regular followup.

## 2011-10-15 NOTE — Assessment & Plan Note (Signed)
The patient continues to be plagued by chest pain and multiple other somatic complaints. He is truly incapacitated and I wonder if he has an underlying generalized anxiety disorder that has been untreated. I have asked him to followup with his primary physician or with a psychiatrist. In the meantime, I truly believe this patient is incapable of working. He has a physically demanding job and does not have the education or tools necessary for a less physical job. I have written a letter in support of him gaining disability.  We spent over 30 minutes in discussion today reviewing the importance of seeking help for his suspected anxiety disorder and other related issues.
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
## 2012-01-29 ENCOUNTER — Other Ambulatory Visit: Payer: Self-pay | Admitting: Cardiovascular Disease

## 2012-05-29 DIAGNOSIS — R42 Dizziness and giddiness: Secondary | ICD-10-CM

## 2012-06-04 ENCOUNTER — Other Ambulatory Visit: Payer: Self-pay | Admitting: Cardiovascular Disease

## 2012-06-04 NOTE — Telephone Encounter (Signed)
..   Requested Prescriptions   Pending Prescriptions Disp Refills  . NITROSTAT 0.4 MG SL tablet [Pharmacy Med Name: NITROSTAT 0.4MG      SUB PFIZ] 25 tablet 0    Sig: DISSOLVE 1 TABLET UNDER TONGUE EVERY 5 MINUTES AS NEEDED FOR CHESTPAIN - CALL 911 AFTER 2 TABLETS

## 2012-08-02 ENCOUNTER — Other Ambulatory Visit: Payer: Self-pay | Admitting: Cardiovascular Disease

## 2012-08-13 ENCOUNTER — Other Ambulatory Visit: Payer: Self-pay | Admitting: *Deleted

## 2012-08-13 MED ORDER — LISINOPRIL 10 MG PO TABS: 10.0000 mg | ORAL_TABLET | Freq: Every day | ORAL | Status: DC

## 2012-10-10 ENCOUNTER — Encounter: Payer: Self-pay | Admitting: Cardiovascular Disease

## 2012-10-10 ENCOUNTER — Ambulatory Visit (INDEPENDENT_AMBULATORY_CARE_PROVIDER_SITE_OTHER): Payer: Self-pay | Admitting: Cardiovascular Disease

## 2012-10-10 VITALS — BP 112/86 | HR 57 | Ht 64.0 in | Wt 160.0 lb

## 2012-10-10 DIAGNOSIS — I359 Nonrheumatic aortic valve disorder, unspecified: Secondary | ICD-10-CM

## 2012-10-10 DIAGNOSIS — R002 Palpitations: Secondary | ICD-10-CM

## 2012-10-10 NOTE — Patient Instructions (Addendum)
Your physician wants you to follow-up in: 1 YEAR with Dr Cooper.  You will receive a reminder letter in the mail two months in advance. If you don't receive a letter, please call our office to schedule the follow-up appointment.  Your physician recommends that you continue on your current medications as directed. Please refer to the Current Medication list given to you today.  
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
## 2012-12-10 ENCOUNTER — Other Ambulatory Visit: Payer: Self-pay | Admitting: Cardiovascular Disease

## 2012-12-23 ENCOUNTER — Telehealth: Payer: Self-pay | Admitting: Cardiovascular Disease

## 2012-12-23 MED ORDER — AMOXICILLIN 500 MG PO TABS: ORAL_TABLET | ORAL | Status: DC

## 2012-12-23 NOTE — Telephone Encounter (Signed)
I spoke with the pt's wife and made her aware Rx sent to pharmacy.

## 2012-12-23 NOTE — Telephone Encounter (Signed)
New Prob    Pt is having dental work done soon. Calling in needing some antibiotics prescribed before procedure.Marland Kitchen

## 2013-01-07 ENCOUNTER — Other Ambulatory Visit: Payer: Self-pay | Admitting: Cardiovascular Disease

## 2013-02-18 ENCOUNTER — Encounter: Payer: Self-pay | Admitting: Cardiovascular Disease

## 2013-02-18 ENCOUNTER — Ambulatory Visit (INDEPENDENT_AMBULATORY_CARE_PROVIDER_SITE_OTHER): Payer: Medicaid Other | Admitting: Cardiovascular Disease

## 2013-02-18 VITALS — BP 134/78 | HR 58 | Ht 64.0 in | Wt 159.0 lb

## 2013-02-18 DIAGNOSIS — I359 Nonrheumatic aortic valve disorder, unspecified: Secondary | ICD-10-CM

## 2013-02-18 MED ORDER — METOPROLOL SUCCINATE ER 25 MG PO TB24: 25.0000 mg | ORAL_TABLET | Freq: Two times a day (BID) | ORAL | Status: DC
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
## 2013-02-18 NOTE — Patient Instructions (Addendum)
Your physician has recommended you make the following change in your medication: CHANGE Metoprolol Succinate to 25mg  take one by mouth twice a day (Metoprolol Succinate 50mg  take one-half tablet by mouth twice a day)  Your physician wants you to follow-up in: 1 YEAR with Dr Excell Seltzer.  You will receive a reminder letter in the mail two months in advance. If you don't receive a letter, please call our office to schedule the follow-up appointment.

## 2013-04-13 ENCOUNTER — Other Ambulatory Visit: Payer: Self-pay | Admitting: Cardiovascular Disease

## 2013-07-14 ENCOUNTER — Other Ambulatory Visit: Payer: Self-pay | Admitting: Cardiovascular Disease

## 2013-09-12 ENCOUNTER — Other Ambulatory Visit: Payer: Self-pay | Admitting: Cardiovascular Disease

## 2013-10-22 ENCOUNTER — Encounter (HOSPITAL_COMMUNITY): Payer: Self-pay | Admitting: Emergency Medicine

## 2013-10-22 ENCOUNTER — Emergency Department (HOSPITAL_COMMUNITY): Payer: Medicaid Other

## 2013-10-22 DIAGNOSIS — Z9889 Other specified postprocedural states: Secondary | ICD-10-CM | POA: Insufficient documentation

## 2013-10-22 DIAGNOSIS — R011 Cardiac murmur, unspecified: Secondary | ICD-10-CM | POA: Insufficient documentation

## 2013-10-22 DIAGNOSIS — K219 Gastro-esophageal reflux disease without esophagitis: Secondary | ICD-10-CM | POA: Insufficient documentation

## 2013-10-22 DIAGNOSIS — Z954 Presence of other heart-valve replacement: Secondary | ICD-10-CM | POA: Insufficient documentation

## 2013-10-22 DIAGNOSIS — Z7982 Long term (current) use of aspirin: Secondary | ICD-10-CM | POA: Insufficient documentation

## 2013-10-22 DIAGNOSIS — Z79899 Other long term (current) drug therapy: Secondary | ICD-10-CM | POA: Insufficient documentation

## 2013-10-22 DIAGNOSIS — R0789 Other chest pain: Secondary | ICD-10-CM | POA: Insufficient documentation

## 2013-10-22 DIAGNOSIS — I472 Ventricular tachycardia, unspecified: Secondary | ICD-10-CM | POA: Insufficient documentation

## 2013-10-22 DIAGNOSIS — Z87828 Personal history of other (healed) physical injury and trauma: Secondary | ICD-10-CM | POA: Insufficient documentation

## 2013-10-22 DIAGNOSIS — R61 Generalized hyperhidrosis: Secondary | ICD-10-CM | POA: Insufficient documentation

## 2013-10-22 DIAGNOSIS — R110 Nausea: Secondary | ICD-10-CM | POA: Insufficient documentation

## 2013-10-22 DIAGNOSIS — I4729 Other ventricular tachycardia: Secondary | ICD-10-CM | POA: Insufficient documentation

## 2013-10-22 DIAGNOSIS — R0609 Other forms of dyspnea: Secondary | ICD-10-CM | POA: Insufficient documentation

## 2013-10-22 DIAGNOSIS — I10 Essential (primary) hypertension: Secondary | ICD-10-CM | POA: Insufficient documentation

## 2013-10-22 DIAGNOSIS — R0989 Other specified symptoms and signs involving the circulatory and respiratory systems: Secondary | ICD-10-CM | POA: Insufficient documentation

## 2013-10-22 DIAGNOSIS — R42 Dizziness and giddiness: Secondary | ICD-10-CM | POA: Insufficient documentation

## 2013-10-22 LAB — CBC
HEMATOCRIT: 40.3 % (ref 39.0–52.0)
Hemoglobin: 14.8 g/dL (ref 13.0–17.0)
MCH: 31.2 pg (ref 26.0–34.0)
MCHC: 36.7 g/dL — ABNORMAL HIGH (ref 30.0–36.0)
MCV: 85 fL (ref 78.0–100.0)
Platelets: 188 10*3/uL (ref 150–400)
RBC: 4.74 MIL/uL (ref 4.22–5.81)
RDW: 13.2 % (ref 11.5–15.5)
WBC: 6.5 10*3/uL (ref 4.0–10.5)

## 2013-10-22 LAB — BASIC METABOLIC PANEL
BUN: 14 mg/dL (ref 6–23)
CHLORIDE: 99 meq/L (ref 96–112)
CO2: 24 mEq/L (ref 19–32)
CREATININE: 0.71 mg/dL (ref 0.50–1.35)
Calcium: 9.4 mg/dL (ref 8.4–10.5)
GFR calc Af Amer: >90 mL/min (ref >90)
GFR calc non Af Amer: >90 mL/min (ref >90)
Glucose, Bld: 106 mg/dL — ABNORMAL HIGH (ref 70–99)
Potassium: 3.9 mEq/L (ref 3.7–5.3)
Sodium: 136 mEq/L — ABNORMAL LOW (ref 137–147)

## 2013-10-22 LAB — POCT I-STAT TROPONIN I: Troponin i, poc: 0 ng/mL (ref 0.00–0.08)

## 2013-10-22 NOTE — ED Notes (Signed)
Report from [facility]> Pt reports feeling shaky, dizziness, and dry mouth that started tonight while at home.  States symptoms have now resolved but having pain to L chest/ under L axilla x 10 min. Denies sob, nausea, vomiting.

## 2013-10-23 ENCOUNTER — Emergency Department (HOSPITAL_COMMUNITY)
Admission: EM | Admit: 2013-10-23 | Discharge: 2013-10-23 | Disposition: A | Discharge status: Home / Self Care | Payer: Medicaid Other | Attending: Emergency Medicine | Admitting: Emergency Medicine

## 2013-10-23 DIAGNOSIS — R079 Chest pain, unspecified: Secondary | ICD-10-CM

## 2013-10-23 DIAGNOSIS — R42 Dizziness and giddiness: Secondary | ICD-10-CM

## 2013-10-23 MED ORDER — SODIUM CHLORIDE 0.9 % IV BOLUS (SEPSIS)
1000.0000 mL | Freq: Once | INTRAVENOUS | Status: AC
  Administered 2013-10-23: 1000 mL via INTRAVENOUS

## 2013-10-23 NOTE — Discharge Instructions (Signed)
Your tests did not show anything serious, but your heart rate did increase when you stood up. Please drink plenty of fluids, and follow up with your heart doctor.  Chest Pain (Nonspecific) It is often hard to give a specific diagnosis for the cause of chest pain. There is always a chance that your pain could be related to something serious, such as a heart attack or a blood clot in the lungs. You need to follow up with your caregiver for further evaluation. CAUSES   Heartburn.  Pneumonia or bronchitis.  Anxiety or stress.  Inflammation around your heart (pericarditis) or lung (pleuritis or pleurisy).  A blood clot in the lung.  A collapsed lung (pneumothorax). It can develop suddenly on its own (spontaneous pneumothorax) or from injury (trauma) to the chest.  Shingles infection (herpes zoster virus). The chest wall is composed of bones, muscles, and cartilage. Any of these can be the source of the pain.  The bones can be bruised by injury.  The muscles or cartilage can be strained by coughing or overwork.  The cartilage can be affected by inflammation and become sore (costochondritis). DIAGNOSIS  Lab tests or other studies, such as X-rays, electrocardiography, stress testing, or cardiac imaging, may be needed to find the cause of your pain.  TREATMENT   Treatment depends on what may be causing your chest pain. Treatment may include:  Acid blockers for heartburn.  Anti-inflammatory medicine.  Pain medicine for inflammatory conditions.  Antibiotics if an infection is present.  You may be advised to change lifestyle habits. This includes stopping smoking and avoiding alcohol, caffeine, and chocolate.  You may be advised to keep your head raised (elevated) when sleeping. This reduces the chance of acid going backward from your stomach into your esophagus.  Most of the time, nonspecific chest pain will improve within 2 to 3 days with rest and mild pain medicine. HOME CARE  INSTRUCTIONS   If antibiotics were prescribed, take your antibiotics as directed. Finish them even if you start to feel better.  For the next few days, avoid physical activities that bring on chest pain. Continue physical activities as directed.  Do not smoke.  Avoid drinking alcohol.  Only take over-the-counter or prescription medicine for pain, discomfort, or fever as directed by your caregiver.  Follow your caregiver's suggestions for further testing if your chest pain does not go away.  Keep any follow-up appointments you made. If you do not go to an appointment, you could develop lasting (chronic) problems with pain. If there is any problem keeping an appointment, you must call to reschedule. SEEK MEDICAL CARE IF:   You think you are having problems from the medicine you are taking. Read your medicine instructions carefully.  Your chest pain does not go away, even after treatment.  You develop a rash with blisters on your chest. SEEK IMMEDIATE MEDICAL CARE IF:   You have increased chest pain or pain that spreads to your arm, neck, jaw, back, or abdomen.  You develop shortness of breath, an increasing cough, or you are coughing up blood.  You have severe back or abdominal pain, feel nauseous, or vomit.  You develop severe weakness, fainting, or chills.  You have a fever. THIS IS AN EMERGENCY. Do not wait to see if the pain will go away. Get medical help at once. Call your local emergency services (911 in [place] Do not drive yourself to the hospital. MAKE SURE YOU:   Understand these instructions.  Will watch your  condition.  Will get help right away if you are not doing well or get worse. Document Released: 06/07/2005 Document Revised: 11/20/2011 Document Reviewed: 04/02/2008 [facility] Patient Information 2014 [place].  Dizziness Dizziness is a common problem. It is a feeling of unsteadiness or lightheadedness. You may feel like you are about to faint.  Dizziness can lead to injury if you stumble or fall. A person of any age group can suffer from dizziness, but dizziness is more common in older adults. CAUSES  Dizziness can be caused by many different things, including:  Middle ear problems.  Standing for too long.  Infections.  An allergic reaction.  Aging.  An emotional response to something, such as the sight of blood.  Side effects of medicines.  Fatigue.  Problems with circulation or blood pressure.  Excess use of alcohol, medicines, or illegal drug use.  Breathing too fast (hyperventilation).  An arrhythmia or problems with your heart rhythm.  Low red blood cell count (anemia).  Pregnancy.  Vomiting, diarrhea, fever, or other illnesses that cause dehydration.  Diseases or conditions such as Parkinson's disease, high blood pressure (hypertension), diabetes, and thyroid problems.  Exposure to extreme heat. DIAGNOSIS  To find the cause of your dizziness, your caregiver may do a physical exam, lab tests, radiologic imaging scans, or an electrocardiography test (ECG).  TREATMENT  Treatment of dizziness depends on the cause of your symptoms and can vary greatly. HOME CARE INSTRUCTIONS   Drink enough fluids to keep your urine clear or pale yellow. This is especially important in very hot weather. In the elderly, it is also important in cold weather.  If your dizziness is caused by medicines, take them exactly as directed. When taking blood pressure medicines, it is especially important to get up slowly.  Rise slowly from chairs and steady yourself until you feel okay.  In the morning, first sit up on the side of the bed. When this seems okay, stand slowly while holding onto something until you know your balance is fine.  If you need to stand in one place for a long time, be sure to move your legs often. Tighten and relax the muscles in your legs while standing.  If dizziness continues to be a problem, have someone  stay with you for a day or two. Do this until you feel you are well enough to stay alone. Have the person call your caregiver if he or she notices changes in you that are concerning.  Do not drive or use heavy machinery if you feel dizzy.  Do not drink alcohol. SEEK IMMEDIATE MEDICAL CARE IF:   Your dizziness or lightheadedness gets worse.  You feel nauseous or vomit.  You develop problems with talking, walking, weakness, or using your arms, hands, or legs.  You are not thinking clearly or you have difficulty forming sentences. It may take a friend or family member to determine if your thinking is normal.  You develop chest pain, abdominal pain, shortness of breath, or sweating.  Your vision changes.  You notice any bleeding.  You have side effects from medicine that seems to be getting worse rather than better. MAKE SURE YOU:   Understand these instructions.  Will watch your condition.  Will get help right away if you are not doing well or get worse. Document Released: 02/21/2001 Document Revised: 11/20/2011 Document Reviewed: 03/17/2011 [facility] Patient Information 2014 [place], [place].

## 2013-10-23 NOTE — ED Notes (Signed)
Pt also c/o dry mouth and dizziness

## 2013-10-23 NOTE — ED Notes (Signed)
Pt c/o chest pain in left upper part of chest, just below nipple line and under arm 4/10

## 2013-10-23 NOTE — ED Notes (Signed)
MD at bedside. 
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
## 2013-11-11 ENCOUNTER — Other Ambulatory Visit: Payer: Self-pay | Admitting: Cardiovascular Disease

## 2014-02-09 ENCOUNTER — Other Ambulatory Visit: Payer: Self-pay | Admitting: Cardiovascular Disease

## 2014-02-27 ENCOUNTER — Ambulatory Visit: Payer: Medicaid Other | Admitting: Cardiovascular Disease

## 2014-02-27 ENCOUNTER — Encounter: Payer: Self-pay | Admitting: Cardiovascular Disease

## 2014-02-27 ENCOUNTER — Ambulatory Visit (INDEPENDENT_AMBULATORY_CARE_PROVIDER_SITE_OTHER): Payer: Medicaid Other | Admitting: Cardiovascular Disease

## 2014-02-27 VITALS — BP 115/78 | HR 77 | Ht 64.0 in | Wt 160.8 lb

## 2014-02-27 DIAGNOSIS — I359 Nonrheumatic aortic valve disorder, unspecified: Secondary | ICD-10-CM

## 2014-02-27 MED ORDER — AMOXICILLIN 500 MG PO TABS: ORAL_TABLET | ORAL | Status: DC

## 2014-02-27 MED ORDER — NITROGLYCERIN 0.4 MG SL SUBL: 0.4000 mg | SUBLINGUAL_TABLET | SUBLINGUAL | Status: DC | PRN
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
## 2014-02-27 NOTE — Patient Instructions (Signed)
Your physician wants you to follow-up in: 1 YEAR with Dr Cooper.  You will receive a reminder letter in the mail two months in advance. If you don't receive a letter, please call our office to schedule the follow-up appointment.  Your physician recommends that you continue on your current medications as directed. Please refer to the Current Medication list given to you today.  

## 2014-03-11 ENCOUNTER — Other Ambulatory Visit: Payer: Self-pay | Admitting: Cardiovascular Disease

## 2014-04-29 DIAGNOSIS — K219 Gastro-esophageal reflux disease without esophagitis: Secondary | ICD-10-CM | POA: Diagnosis not present

## 2014-04-29 DIAGNOSIS — I10 Essential (primary) hypertension: Secondary | ICD-10-CM | POA: Diagnosis not present

## 2014-04-29 DIAGNOSIS — F411 Generalized anxiety disorder: Secondary | ICD-10-CM | POA: Diagnosis not present

## 2014-04-29 DIAGNOSIS — G609 Hereditary and idiopathic neuropathy, unspecified: Secondary | ICD-10-CM | POA: Diagnosis not present

## 2014-04-29 DIAGNOSIS — E782 Mixed hyperlipidemia: Secondary | ICD-10-CM | POA: Diagnosis not present

## 2014-04-29 DIAGNOSIS — G4700 Insomnia, unspecified: Secondary | ICD-10-CM | POA: Diagnosis not present

## 2014-04-29 DIAGNOSIS — Z954 Presence of other heart-valve replacement: Secondary | ICD-10-CM | POA: Diagnosis not present

## 2014-06-10 ENCOUNTER — Telehealth: Payer: Self-pay | Admitting: Cardiovascular Disease

## 2014-06-10 NOTE — Telephone Encounter (Signed)
New Message  [facility] with [facility], called to follow up on the request for cardiac clearance. Please call back to discuss.

## 2014-06-10 NOTE — Telephone Encounter (Signed)
I contacted [facility] again and the gentleman who answers the phone attempted to reach [facility] but said she was on another line.  He asked me if I knew her last name so that he could send her a message and I told him that I did not know this information. He also attempted to use the extension that was previously given.  He placed me on hold for a third time and then when he came back to the phone he asked me to call back in a few minutes.  I advised him that I have already made another attempt to speak with [facility] and I will not call back. If they need to speak with our office then they can contact us again.  The pt's request for clearance is in Dr [facility] folder for review.

## 2014-06-10 NOTE — Telephone Encounter (Signed)
I contacted [facility] and they placed me on hold for 5 minutes.  I hung up without speaking with anyone. I will try to contact them again later today.

## 2014-06-16 DIAGNOSIS — R0781 Pleurodynia: Secondary | ICD-10-CM | POA: Diagnosis not present

## 2014-06-16 DIAGNOSIS — G629 Polyneuropathy, unspecified: Secondary | ICD-10-CM | POA: Diagnosis not present

## 2014-06-23 DIAGNOSIS — M545 Low back pain: Secondary | ICD-10-CM | POA: Diagnosis not present

## 2014-06-25 DIAGNOSIS — M545 Low back pain: Secondary | ICD-10-CM | POA: Diagnosis not present

## 2014-06-26 ENCOUNTER — Telehealth: Payer: Self-pay | Admitting: Cardiovascular Disease

## 2014-06-26 NOTE — Telephone Encounter (Signed)
Walk in Pt Form "Smart [employer]" papers Dropped Off Lauren back Tuesday will give to her then

## 2014-06-29 DIAGNOSIS — M545 Low back pain: Secondary | ICD-10-CM | POA: Diagnosis not present

## 2014-07-02 DIAGNOSIS — Z79899 Other long term (current) drug therapy: Secondary | ICD-10-CM | POA: Diagnosis not present

## 2014-07-02 DIAGNOSIS — R42 Dizziness and giddiness: Secondary | ICD-10-CM | POA: Diagnosis not present

## 2014-07-02 DIAGNOSIS — Z953 Presence of xenogenic heart valve: Secondary | ICD-10-CM | POA: Diagnosis not present

## 2014-07-02 DIAGNOSIS — R531 Weakness: Secondary | ICD-10-CM | POA: Diagnosis not present

## 2014-07-02 DIAGNOSIS — Z8679 Personal history of other diseases of the circulatory system: Secondary | ICD-10-CM | POA: Diagnosis not present

## 2014-07-02 DIAGNOSIS — R001 Bradycardia, unspecified: Secondary | ICD-10-CM | POA: Diagnosis not present

## 2014-07-02 DIAGNOSIS — I951 Orthostatic hypotension: Secondary | ICD-10-CM | POA: Diagnosis not present

## 2014-07-02 DIAGNOSIS — I10 Essential (primary) hypertension: Secondary | ICD-10-CM | POA: Diagnosis not present

## 2014-07-02 DIAGNOSIS — I959 Hypotension, unspecified: Secondary | ICD-10-CM | POA: Diagnosis not present

## 2014-07-03 ENCOUNTER — Emergency Department (HOSPITAL_COMMUNITY): Payer: Medicare Other

## 2014-07-03 ENCOUNTER — Encounter (HOSPITAL_COMMUNITY): Payer: Self-pay | Admitting: Emergency Medicine

## 2014-07-03 ENCOUNTER — Emergency Department (HOSPITAL_COMMUNITY)
Admission: EM | Admit: 2014-07-03 | Discharge: 2014-07-03 | Disposition: A | Discharge status: Home / Self Care | Payer: Medicare Other | Attending: Emergency Medicine | Admitting: Emergency Medicine

## 2014-07-03 DIAGNOSIS — Z7982 Long term (current) use of aspirin: Secondary | ICD-10-CM | POA: Diagnosis not present

## 2014-07-03 DIAGNOSIS — R011 Cardiac murmur, unspecified: Secondary | ICD-10-CM | POA: Diagnosis not present

## 2014-07-03 DIAGNOSIS — M545 Low back pain: Secondary | ICD-10-CM | POA: Diagnosis not present

## 2014-07-03 DIAGNOSIS — R079 Chest pain, unspecified: Secondary | ICD-10-CM | POA: Insufficient documentation

## 2014-07-03 DIAGNOSIS — Z79899 Other long term (current) drug therapy: Secondary | ICD-10-CM | POA: Diagnosis not present

## 2014-07-03 DIAGNOSIS — R072 Precordial pain: Secondary | ICD-10-CM | POA: Diagnosis not present

## 2014-07-03 DIAGNOSIS — I10 Essential (primary) hypertension: Secondary | ICD-10-CM | POA: Insufficient documentation

## 2014-07-03 DIAGNOSIS — K219 Gastro-esophageal reflux disease without esophagitis: Secondary | ICD-10-CM | POA: Diagnosis not present

## 2014-07-03 LAB — CBC
HCT: 41.9 % (ref 39.0–52.0)
Hemoglobin: 15 g/dL (ref 13.0–17.0)
MCH: 31 pg (ref 26.0–34.0)
MCHC: 35.8 g/dL (ref 30.0–36.0)
MCV: 86.6 fL (ref 78.0–100.0)
Platelets: 228 10*3/uL (ref 150–400)
RBC: 4.84 MIL/uL (ref 4.22–5.81)
RDW: 13.6 % (ref 11.5–15.5)
WBC: 4.7 10*3/uL (ref 4.0–10.5)

## 2014-07-03 LAB — BASIC METABOLIC PANEL
Anion gap: 13 (ref 5–15)
BUN: 10 mg/dL (ref 6–23)
CO2: 26 meq/L (ref 19–32)
Calcium: 9.6 mg/dL (ref 8.4–10.5)
Chloride: 103 mEq/L (ref 96–112)
Creatinine, Ser: 0.75 mg/dL (ref 0.50–1.35)
GFR calc Af Amer: >90 mL/min (ref >90)
GFR calc non Af Amer: >90 mL/min (ref >90)
Glucose, Bld: 81 mg/dL (ref 70–99)
Potassium: 4.1 mEq/L (ref 3.7–5.3)
Sodium: 142 mEq/L (ref 137–147)

## 2014-07-03 LAB — I-STAT TROPONIN, ED: Troponin i, poc: 0 ng/mL (ref 0.00–0.08)

## 2014-07-03 MED ORDER — SODIUM CHLORIDE 0.9 % IV BOLUS (SEPSIS)
500.0000 mL | Freq: Once | INTRAVENOUS | Status: AC
  Administered 2014-07-03: 500 mL via INTRAVENOUS

## 2014-07-03 NOTE — ED Notes (Signed)
Patient states was supposed to have colonoscopy yesterday, but when the IV of saline was started, his blood pressure dropped to 65/24.   He states he started having chest pain with diaphoresis and dizziness then.   The chest pain and symptoms resolved yesterday, but started back up today.   Patient complains of mid sternal chest pain with no radiation today.   Denies all other symptoms except nausea.
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
## 2014-07-03 NOTE — Discharge Instructions (Signed)

## 2014-07-06 DIAGNOSIS — M545 Low back pain: Secondary | ICD-10-CM | POA: Diagnosis not present

## 2014-07-08 DIAGNOSIS — M545 Low back pain: Secondary | ICD-10-CM | POA: Diagnosis not present

## 2014-07-10 NOTE — ED Provider Notes (Signed)
Medical screening examination/treatment/procedure(s) were performed by non-physician practitioner and as supervising physician I was immediately available for consultation/collaboration.   EKG Interpretation   Date/Time:  Friday July 03 2014 11:55:58 EDT Ventricular Rate:  70 PR Interval:  160 QRS Duration: 148 QT Interval:  404 QTC Calculation: 436 R Axis:   -38 Text Interpretation:  Normal sinus rhythm Left axis deviation Right bundle  branch block Abnormal ECG ED PHYSICIAN INTERPRETATION AVAILABLE IN [facility] Confirmed by TEST, Record ([id number]) on 07/05/2014 12:28:03 PM       Babette Relic, MD 07/10/14 [phone]

## 2014-07-14 DIAGNOSIS — M545 Low back pain: Secondary | ICD-10-CM | POA: Diagnosis not present

## 2014-07-15 ENCOUNTER — Encounter: Payer: Self-pay | Admitting: Nurse Practitioner

## 2014-07-15 ENCOUNTER — Ambulatory Visit (INDEPENDENT_AMBULATORY_CARE_PROVIDER_SITE_OTHER): Payer: Medicare Other | Admitting: Nurse Practitioner

## 2014-07-15 VITALS — BP 120/80 | HR 72 | Ht 64.0 in | Wt 161.8 lb

## 2014-07-15 DIAGNOSIS — R079 Chest pain, unspecified: Secondary | ICD-10-CM | POA: Diagnosis not present

## 2014-07-15 DIAGNOSIS — Z954 Presence of other heart-valve replacement: Secondary | ICD-10-CM

## 2014-07-15 DIAGNOSIS — Z952 Presence of prosthetic heart valve: Secondary | ICD-10-CM
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
## 2014-07-15 NOTE — Patient Instructions (Addendum)
We will arrange for a GXT and echocardiogram.   Stay on your current medicines but when you need your Toprol refilled - let us know - we will need to change this to a different form  Keep your follow up with Dr. Burt Knack as planned  If these tests are ok - you may then have your colonoscopy  Need to sign a release for the GI person you saw in [place] for an attempted colonoscopy  Call the [place] office at [phone] if you have any questions, problems or concerns.

## 2014-07-16 ENCOUNTER — Ambulatory Visit: Payer: Medicaid Other | Admitting: Cardiovascular Disease

## 2014-07-16 DIAGNOSIS — M545 Low back pain: Secondary | ICD-10-CM | POA: Diagnosis not present

## 2014-07-20 ENCOUNTER — Telehealth: Payer: Self-pay | Admitting: Nurse Practitioner

## 2014-07-20 DIAGNOSIS — M545 Low back pain: Secondary | ICD-10-CM | POA: Diagnosis not present

## 2014-07-20 NOTE — Telephone Encounter (Signed)
ROI faxed to [facility] [phone]   11.9.15/km

## 2014-07-22 DIAGNOSIS — M545 Low back pain: Secondary | ICD-10-CM | POA: Diagnosis not present

## 2014-07-23 ENCOUNTER — Ambulatory Visit (HOSPITAL_COMMUNITY): Payer: Medicare Other | Attending: Student | Admitting: Radiology

## 2014-07-23 DIAGNOSIS — I10 Essential (primary) hypertension: Secondary | ICD-10-CM | POA: Diagnosis not present

## 2014-07-23 DIAGNOSIS — Z954 Presence of other heart-valve replacement: Secondary | ICD-10-CM | POA: Diagnosis not present

## 2014-07-23 DIAGNOSIS — R079 Chest pain, unspecified: Secondary | ICD-10-CM | POA: Diagnosis not present

## 2014-07-23 DIAGNOSIS — Z952 Presence of prosthetic heart valve: Secondary | ICD-10-CM

## 2014-07-23 NOTE — Progress Notes (Signed)
Echocardiogram performed.  

## 2014-07-27 ENCOUNTER — Encounter: Payer: Self-pay | Admitting: *Deleted

## 2014-07-28 DIAGNOSIS — M545 Low back pain: Secondary | ICD-10-CM | POA: Diagnosis not present

## 2014-07-30 DIAGNOSIS — M545 Low back pain: Secondary | ICD-10-CM | POA: Diagnosis not present

## 2014-08-04 DIAGNOSIS — M545 Low back pain: Secondary | ICD-10-CM | POA: Diagnosis not present

## 2014-08-07 DIAGNOSIS — M545 Low back pain: Secondary | ICD-10-CM | POA: Diagnosis not present

## 2014-08-11 ENCOUNTER — Other Ambulatory Visit: Payer: Self-pay | Admitting: Cardiovascular Disease

## 2014-08-11 DIAGNOSIS — M545 Low back pain: Secondary | ICD-10-CM | POA: Diagnosis not present

## 2014-08-13 DIAGNOSIS — M545 Low back pain: Secondary | ICD-10-CM | POA: Diagnosis not present

## 2014-08-14 ENCOUNTER — Ambulatory Visit (INDEPENDENT_AMBULATORY_CARE_PROVIDER_SITE_OTHER): Payer: Medicare Other | Admitting: Physician Assistant

## 2014-08-14 DIAGNOSIS — Z954 Presence of other heart-valve replacement: Secondary | ICD-10-CM | POA: Diagnosis not present

## 2014-08-14 DIAGNOSIS — R079 Chest pain, unspecified: Secondary | ICD-10-CM

## 2014-08-14 DIAGNOSIS — Z952 Presence of prosthetic heart valve: Secondary | ICD-10-CM

## 2014-08-14 NOTE — Progress Notes (Signed)
Exercise Treadmill Test  Pre-Exercise Testing Evaluation Rhythm: normal sinus  Rate: 70 bpm     Test  Exercise Tolerance Test Ordering MD: Sherren Mocha, MD  Interpreting MD: Richardson Dopp, PA-C  Unique Test No: 1  Treadmill:  1  Indication for ETT: chest pain - rule out ischemia  Contraindication to ETT: No   Stress Modality: exercise - treadmill  Cardiac Imaging Performed: non   Protocol: standard Bruce - maximal  Max BP:  181/100  Max MPHR (bpm):  168 85% MPR (bpm):  143  MPHR obtained (bpm):  151 % MPHR obtained:  89  Reached 85% MPHR (min:sec):  6:45 Total Exercise Time (min-sec):  8:00  Workload in METS:  10.0 Borg Scale: 15  Reason ETT Terminated:  desired heart rate attained    ST Segment Analysis At Rest: non-specific ST segment slurring - RBBB With Exercise: non-specific ST changes  Other Information Arrhythmia:  No Angina during ETT:  absent (0) Quality of ETT:  diagnostic  ETT Interpretation:  normal - no evidence of ischemia by ST analysis  Comments: Good exercise capacity. No chest pain. Normal BP response to exercise. Baseline RBBB.  ST segments somewhat abnormal.  No significant change during exercise when compared to baseline. There was insignificant up-sloping ST depression. No exercise induced arrhythmias. Overall, low risk ETT.  Recommendations: FU with Dr. Sherren Mocha as planned. Signed,  Richardson Dopp, PA-C   08/14/2014 11:32 AM

## 2014-08-18 DIAGNOSIS — M545 Low back pain: Secondary | ICD-10-CM | POA: Diagnosis not present

## 2014-09-16 ENCOUNTER — Other Ambulatory Visit: Payer: Self-pay | Admitting: Cardiovascular Disease

## 2014-09-16 DIAGNOSIS — Z954 Presence of other heart-valve replacement: Secondary | ICD-10-CM | POA: Diagnosis not present

## 2014-09-16 DIAGNOSIS — G629 Polyneuropathy, unspecified: Secondary | ICD-10-CM | POA: Diagnosis not present

## 2014-09-16 DIAGNOSIS — K219 Gastro-esophageal reflux disease without esophagitis: Secondary | ICD-10-CM | POA: Diagnosis not present

## 2014-09-16 DIAGNOSIS — E782 Mixed hyperlipidemia: Secondary | ICD-10-CM | POA: Diagnosis not present

## 2014-09-16 DIAGNOSIS — F411 Generalized anxiety disorder: Secondary | ICD-10-CM | POA: Diagnosis not present

## 2014-09-16 DIAGNOSIS — I10 Essential (primary) hypertension: Secondary | ICD-10-CM | POA: Diagnosis not present

## 2014-10-09 ENCOUNTER — Telehealth: Payer: Self-pay | Admitting: Cardiovascular Disease

## 2014-10-09 NOTE — Telephone Encounter (Signed)
Request for surgical clearance:  1. What type of surgery is being performed? Colonoscapy  2. When is this surgery scheduled? 10/09/14  3. Are there any medications that need to be held prior to surgery and how long? Lisinopril on the morning of surg  4. Name of physician performing surgery? Dr. Harl Bowie  5. What is your office phone and fax number? Orma Render at [phone]  Calling stating that he needs for Dr. Burt Knack to review pt's most recent stress echo and let them know if pt is cleared for surg. Please call back and advise.

## 2014-10-09 NOTE — Telephone Encounter (Signed)
Returned [employer]. 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 Burt Knack to give him surgery clearance for Mr. Carson for a colonoscopy on next Friday ( 10/16/2014). Will forward to Dr. Burt Knack.

## 2014-10-13 ENCOUNTER — Telehealth: Payer: Self-pay | Admitting: Family Medicine

## 2014-10-13 DIAGNOSIS — H04123 Dry eye syndrome of bilateral lacrimal glands: Secondary | ICD-10-CM | POA: Diagnosis not present

## 2014-10-13 NOTE — Telephone Encounter (Signed)
Chart reviewed - low risk ETT in December 2015. Ok to proceed with colonoscopy at low risk of cardiac complication. Should receive antibiotic for SBE prophylaxis. thx

## 2014-10-13 NOTE — Telephone Encounter (Signed)
Patient has Medicare and Medicaid Toprol XL 25mg  BID, lisinopril 10mg , asa 81mg , nitrostat prn, omeprazole 40, amox prn dental procedure, melatonin, pataday, restasis, selenium 027, garlic, fish oil bid, metamucil, magnesium, niacin bid, centrum silver, Appointment scheduled for 2/29 with Sabra Heck

## 2014-10-13 NOTE — Telephone Encounter (Signed)
I spoke with Orma Render PA and made him aware of Dr Antionette Char recommendations. Per Clair Gulling they will arrange SBE.

## 2014-10-16 DIAGNOSIS — Z1211 Encounter for screening for malignant neoplasm of colon: Secondary | ICD-10-CM | POA: Diagnosis not present

## 2014-10-16 DIAGNOSIS — D128 Benign neoplasm of rectum: Secondary | ICD-10-CM | POA: Diagnosis not present

## 2014-10-16 DIAGNOSIS — D124 Benign neoplasm of descending colon: Secondary | ICD-10-CM | POA: Diagnosis not present

## 2014-10-16 DIAGNOSIS — K621 Rectal polyp: Secondary | ICD-10-CM | POA: Diagnosis not present

## 2014-10-16 DIAGNOSIS — Z953 Presence of xenogenic heart valve: Secondary | ICD-10-CM | POA: Diagnosis not present

## 2014-10-16 DIAGNOSIS — Z885 Allergy status to narcotic agent status: Secondary | ICD-10-CM | POA: Diagnosis not present

## 2014-10-16 DIAGNOSIS — E119 Type 2 diabetes mellitus without complications: Secondary | ICD-10-CM | POA: Diagnosis not present

## 2014-10-16 DIAGNOSIS — K625 Hemorrhage of anus and rectum: Secondary | ICD-10-CM | POA: Diagnosis not present

## 2014-10-16 DIAGNOSIS — Z79899 Other long term (current) drug therapy: Secondary | ICD-10-CM | POA: Diagnosis not present

## 2014-10-16 DIAGNOSIS — Z86010 Personal history of colonic polyps: Secondary | ICD-10-CM | POA: Diagnosis not present

## 2014-10-16 DIAGNOSIS — Z7982 Long term (current) use of aspirin: Secondary | ICD-10-CM | POA: Diagnosis not present

## 2014-10-16 DIAGNOSIS — E0590 Thyrotoxicosis, unspecified without thyrotoxic crisis or storm: Secondary | ICD-10-CM | POA: Diagnosis not present

## 2014-10-16 DIAGNOSIS — K648 Other hemorrhoids: Secondary | ICD-10-CM | POA: Diagnosis not present

## 2014-10-16 DIAGNOSIS — K219 Gastro-esophageal reflux disease without esophagitis: Secondary | ICD-10-CM | POA: Diagnosis not present

## 2014-10-16 DIAGNOSIS — F410 Panic disorder [episodic paroxysmal anxiety] without agoraphobia: Secondary | ICD-10-CM | POA: Diagnosis not present

## 2014-10-16 DIAGNOSIS — Z886 Allergy status to analgesic agent status: Secondary | ICD-10-CM | POA: Diagnosis not present

## 2014-10-16 DIAGNOSIS — I10 Essential (primary) hypertension: Secondary | ICD-10-CM | POA: Diagnosis not present

## 2014-10-16 DIAGNOSIS — Z888 Allergy status to other drugs, medicaments and biological substances status: Secondary | ICD-10-CM | POA: Diagnosis not present

## 2014-10-16 HISTORY — PX: COLONOSCOPY: SHX174

## 2014-11-09 ENCOUNTER — Ambulatory Visit (INDEPENDENT_AMBULATORY_CARE_PROVIDER_SITE_OTHER): Payer: Medicare Other | Admitting: Family Medicine

## 2014-11-09 ENCOUNTER — Encounter: Payer: Self-pay | Admitting: Family Medicine

## 2014-11-09 VITALS — BP 121/81 | HR 55 | Temp 97.1°F | Ht 64.0 in | Wt 162.0 lb

## 2014-11-09 DIAGNOSIS — I10 Essential (primary) hypertension: Secondary | ICD-10-CM | POA: Diagnosis not present

## 2014-11-09 DIAGNOSIS — Z952 Presence of prosthetic heart valve: Secondary | ICD-10-CM

## 2014-11-09 DIAGNOSIS — K219 Gastro-esophageal reflux disease without esophagitis: Secondary | ICD-10-CM | POA: Diagnosis not present

## 2014-11-09 DIAGNOSIS — I472 Ventricular tachycardia: Secondary | ICD-10-CM

## 2014-11-09 DIAGNOSIS — I4729 Other ventricular tachycardia: Secondary | ICD-10-CM

## 2014-11-09 DIAGNOSIS — Z954 Presence of other heart-valve replacement: Secondary | ICD-10-CM

## 2014-11-09 MED ORDER — METHADONE HCL 5 MG PO TABS: ORAL_TABLET | ORAL | Status: DC

## 2014-11-09 NOTE — Progress Notes (Signed)
Subjective:    Patient ID: [name], male    DOB: [birth year], 54 y.o.   MRN: [id number]  HPI  54 year old male, new visit. He has been seen here 7 or 8 years ago but has been receiving care at health department in various specialty offices. He had an aortic valve replaced in 2007 with a bovine valve. Now he has occasional chest pain but complains more today of back and leg pain and neuropathy. He cannot take analgesics or Neurontin or Lyrica by history. He does not work as he is disabled. He does not really have symptoms of congestive heart failure by history.  Patient Active Problem List   Diagnosis Date Noted  . Multiple somatic complaints 09/26/2011  . Generalized anxiety disorder 03/14/2011  . DIZZINESS 10/05/2009  . HYPERTENSION, BENIGN 11/05/2008  . AORTIC VALVE DISORDERS 11/05/2008  . ENDOCARDITIS 11/05/2008  . VENTRICULAR TACHYCARDIA 11/05/2008  . CONGESTIVE HEART FAILURE UNSPECIFIED 11/05/2008  . GERD 11/05/2008  . CHEST PAIN-UNSPECIFIED 11/05/2008  . AORTIC VALVE REPLACEMENT, HX OF 11/05/2008   Outpatient Encounter Prescriptions as of 11/09/2014  Medication Sig  . aspirin 81 MG tablet Take 81 mg by mouth daily.    . clonazePAM (KLONOPIN) 1 MG tablet Take 1 mg by mouth at bedtime.   . cycloSPORINE (RESTASIS) 0.05 % ophthalmic emulsion Place 1 drop into both eyes 2 (two) times daily.  . Garlic Oil 4098 MG CAPS Take 1 capsule by mouth daily.    Marland Kitchen lisinopril (PRINIVIL,ZESTRIL) 10 MG tablet TAKE ONE TABLET BY MOUTH ONE TIME DAILY  . metoprolol succinate (TOPROL-XL) 25 MG 24 hr tablet TAKE 1 TABLET BY MOUTH TWICE A DAY WITH OR IMMEDIATELY FOLLOWING A MEAL  . MILK THISTLE PO Take 100 mg by mouth daily.  . Multiple Vitamin (MULTIVITAMIN) tablet Take 1 tablet by mouth daily.    . niacin 100 MG tablet Take 100 mg by mouth daily with breakfast.    . NITROSTAT 0.4 MG SL tablet PLACE 1 TABLET (0.4 MG TOTAL) UNDER THE TONGUE EVERY 5 (FIVE) MINUTES AS NEEDED FOR CHEST PAIN.  Marland Kitchen  olopatadine (PATANOL) 0.1 % ophthalmic solution Place 1 drop into both eyes daily.  Marland Kitchen omeprazole (PRILOSEC) 40 MG capsule Take 40 mg by mouth daily.  Marland Kitchen PATADAY 0.2 % SOLN Place 1 drop into both eyes daily.   . Selenium 200 MCG TABS Take 1 tablet by mouth daily.    . [DISCONTINUED] gabapentin (NEURONTIN) 600 MG tablet Take 600 mg by mouth 2 (two) times daily.   . [DISCONTINUED] Psyllium-Calcium (METAMUCIL PLUS CALCIUM) CAPS Take 1 capsule by mouth 2 (two) times daily. Take 2 pill a day    Review of Systems  Constitutional: Negative.   HENT: Negative.   Respiratory: Negative.   Cardiovascular: Positive for chest pain.  Gastrointestinal: Negative.   Musculoskeletal: Positive for back pain.       Also complains of bilateral leg pain       Objective:   Physical Exam  Constitutional: He is oriented to person, place, and time. He appears well-developed and well-nourished.  HENT:  Right Ear: External ear normal.  Left Ear: External ear normal.  Neck: Normal range of motion.  Cardiovascular: Normal rate and regular rhythm.   Murmur heard. Pulmonary/Chest: Effort normal and breath sounds normal.  Abdominal: Soft. Bowel sounds are normal.  Musculoskeletal: Normal range of motion.  Neurological: He is alert and oriented to person, place, and time. He has normal reflexes.    BP 121/81 mmHg  Pulse 55  Temp(Src) 97.1 F (36.2 C) (Oral)  Ht '5\' 4"'  (1.626 m)  Wt 162 lb (73.483 kg)  BMI 27.79 kg/m2       Assessment & Plan:  1. HYPERTENSION, BENIGN  - Lipid panel - CMP14+EGFR  2. Gastroesophageal reflux disease, esophagitis presence not specified   3. H/O aortic valve replacement  - Lipid panel  4. VENTRICULAR TACHYCARDIA  - Lipid panel 5.Peripheral neuropathy By history, pain prevents normal activity.  Will give trial of methadopne, since he has failed other treatments for same Begin methadone 2.5 mg 2-3 times a day  [name] MD

## 2014-11-10 LAB — CMP14+EGFR
ALK PHOS: 93 IU/L (ref 39–117)
ALT: 35 IU/L (ref 0–44)
AST: 38 IU/L (ref 0–40)
Albumin/Globulin Ratio: 2.5 (ref 1.1–2.5)
Albumin: 5 g/dL (ref 3.5–5.5)
BILIRUBIN TOTAL: 1.7 mg/dL — AB (ref 0.0–1.2)
BUN / CREAT RATIO: 14 (ref 9–20)
BUN: 11 mg/dL (ref 6–24)
CHLORIDE: 101 mmol/L (ref 97–108)
CO2: 25 mmol/L (ref 18–29)
Calcium: 9.9 mg/dL (ref 8.7–10.2)
Creatinine, Ser: 0.76 mg/dL (ref 0.76–1.27)
GFR calc Af Amer: 121 mL/min/{1.73_m2} (ref >59)
GFR calc non Af Amer: 105 mL/min/{1.73_m2} (ref >59)
Globulin, Total: 2 g/dL (ref 1.5–4.5)
Glucose: 91 mg/dL (ref 65–99)
POTASSIUM: 4.1 mmol/L (ref 3.5–5.2)
Sodium: 140 mmol/L (ref 134–144)
Total Protein: 7 g/dL (ref 6.0–8.5)

## 2014-11-10 LAB — LIPID PANEL
CHOL/HDL RATIO: 4.2 ratio (ref 0.0–5.0)
Cholesterol, Total: 205 mg/dL — ABNORMAL HIGH (ref 100–199)
HDL: 49 mg/dL (ref >39)
LDL CALC: 137 mg/dL — AB (ref 0–99)
Triglycerides: 96 mg/dL (ref 0–149)
VLDL Cholesterol Cal: 19 mg/dL (ref 5–40)

## 2014-11-17 ENCOUNTER — Telehealth: Payer: Self-pay | Admitting: Family Medicine

## 2014-11-17 NOTE — Telephone Encounter (Signed)
Patient aware.

## 2014-11-17 NOTE — Telephone Encounter (Signed)
Discontinue med(Methadone)

## 2014-12-16 ENCOUNTER — Other Ambulatory Visit: Payer: Self-pay

## 2014-12-16 MED ORDER — OLOPATADINE HCL 0.2 % OP SOLN: 1.0000 [drp] | Freq: Every day | OPHTHALMIC | Status: DC

## 2014-12-16 MED ORDER — OMEPRAZOLE 40 MG PO CPDR: 40.0000 mg | DELAYED_RELEASE_CAPSULE | Freq: Every day | ORAL | Status: DC

## 2015-02-26 ENCOUNTER — Ambulatory Visit (INDEPENDENT_AMBULATORY_CARE_PROVIDER_SITE_OTHER): Payer: Medicare Other | Admitting: Physician Assistant

## 2015-02-26 ENCOUNTER — Encounter: Payer: Self-pay | Admitting: Physician Assistant

## 2015-02-26 VITALS — BP 117/78 | HR 56 | Temp 98.1°F | Ht 64.0 in | Wt 155.6 lb

## 2015-02-26 DIAGNOSIS — R300 Dysuria: Secondary | ICD-10-CM

## 2015-02-26 LAB — POCT UA - MICROSCOPIC ONLY
Bacteria, U Microscopic: NEGATIVE
CRYSTALS, UR, HPF, POC: NEGATIVE
Casts, Ur, LPF, POC: NEGATIVE
Epithelial cells, urine per micros: NEGATIVE
Mucus, UA: NEGATIVE
RBC, URINE, MICROSCOPIC: NEGATIVE
WBC, UR, HPF, POC: NEGATIVE
YEAST UA: NEGATIVE

## 2015-02-26 LAB — POCT URINALYSIS DIPSTICK
Bilirubin, UA: NEGATIVE
Glucose, UA: NEGATIVE
Ketones, UA: NEGATIVE
Leukocytes, UA: NEGATIVE
NITRITE UA: NEGATIVE
PROTEIN UA: NEGATIVE
RBC UA: NEGATIVE
Spec Grav, UA: 1.015
UROBILINOGEN UA: NEGATIVE
pH, UA: 6.5

## 2015-02-26 NOTE — Patient Instructions (Signed)
Disuria (Dysuria) Es el trmino que se aplica al trastorno de dolor al [employer]. Hay muchas causas de disuria, pero la ms frecuente es la infeccin del tracto urinario. Un anlisis de [place] puede confirmar si tiene una infeccin. Un cultivo de [place] 2 y 3 das. El cultivo de [place] confirma que usted o el nio estn enfermos. Deber concurrir a una visita de control debido a que:  Si le realizaron un cultivo, Civil Service fast streamer y las recomendaciones para Dispensing optician.  Si el cultivo de [place] fue positivo, deber tomar antibiticos o conocer si los antibiticos que le han prescripto son los correctos para su tipo de infeccin.  Si el cultivo es negativo (no hay infeccin del tracto urinario, debern buscar otras causas o habr que suspender los antibiticos. Puede ser que en el da de hoy le hayan hecho anlisis de laboratorio y no se haya hallado infeccin. Si se realizaron Chief of Staff 24 y 26 horas en [employer]. Puede ser que en el da de hoy le hayan tomado radiografas cuyo resultado es normal. No se ha hallado la causa del problema. Las radiografas sern reledas por un radilogo, que se comunicar con usted si encuentra algn resultado adicional. Puede ser que en el da de hoy a usted o a su nio le hayan indicado medicamentos para ayudarlo con su problema hasta que vea al mdico de cabecera. Si mejora, podr consultar con su mdico de cabecera si reapareciera. Si se le han administrado antibiticos (medicamentos que [employer] grmenes), tmelos como se le han indicado Animator. Si se le han realizado anlisis de Designer, fashion/clothing, Careers information officer. Deje un nmero telefnico para poder contactarlo. Si esto no es posible, averige cmo debe [employer]. INSTRUCCIONES PARA EL CUIDADO DOMICILIARIO  Beba gran cantidad de lquidos. Para adultos, beba 8 vasos de agua o jugo por [employer]. Para nios, reponga  los lquidos como le indique su mdico.  Vacie la vejiga con frecuencia. Evite retener la orina durante largos perodos.  Despus de [employer], las mujeres deben limpiarse desde adelante hacia atrs, usando el papel higinico slo una vez.  Vace la vejiga antes y despus de Clinical biochemist.  Tome todos los medicamentos que le han recetado hasta que la infeccin haya desaparecido. Se sentir mejor en [employer], pero debe tomar TODOS LOS MEDICAMENTOS. Si se le ha administrado Pyridium, problemente la orina sea de un color oscuro. Esto puede hacer que su ropa interior se manche por lo que deber Risk manager una Psychologist, sport and exercise.  Evite la cafena, el t, el alcohol y las bebidas carbonatadas, debido a que tienden a Medical illustrator.  En los hombres, el alcohol puede irritar la prstata.  Utilice los medicamentos de venta libre o de prescripcin para Conservation officer, historic buildings, Health and safety inspector o la [place], segn se lo indique el profesional que lo asiste.  Si el profesional que lo [employer] pide que concurra a una cita de seguimiento, es importante asistir a ella. No concurrir a la Multimedia programmer como consecuencia una lesin crnica o [place], dolor, e incapacidad. Si tiene algn problema para asistir a la cita, debe comunicarse con el establecimiento para obtener asistencia. SOLICITE ATENCIN MDICA DE INMEDIATO SI:  Siente dolor en la espalda.  Sube la fiebre.  Si tiene nuseas (ganas de vomitar) o vmitos.  Si el problema no mejora con los medicamentos o [place]. EST SEGURO QUE:  Comprende las instrucciones para el alta mdica.  Controlar  su enfermedad.  Solicitar atencin mdica de inmediato segn las indicaciones. Document Released: 09/17/2007 Document Revised: 11/20/2011 [facility] Patient Information 2015 [place]. This information is not intended to replace advice given to you by your health care provider. Make sure you discuss any questions you have with your  health care provider.

## 2015-02-26 NOTE — Progress Notes (Signed)
Subjective:     Patient ID: [name], male   DOB: 12/28/1960, 54 y.o.   MRN: [id number]  HPI Pt with 2 month intermit hx of dysuria No sx at time of appt Some assoc flank pain with sx but no hematuria Sx usually at the start of urinating  Review of Systems  Constitutional: Negative.   Respiratory: Negative.   Cardiovascular: Negative.   Genitourinary: Positive for dysuria, frequency and flank pain.       Objective:   Physical Exam  Constitutional: He appears well-developed and well-nourished.  Neck: Normal range of motion. Neck supple.  Cardiovascular: Normal rate and regular rhythm.   Murmur heard. Sys eject murmur  Pulmonary/Chest: Breath sounds normal.  Abdominal: Soft. Bowel sounds are normal. He exhibits no distension and no mass. There is no tenderness. There is no rebound and no guarding.  Genitourinary:  No CVAT  Lymphadenopathy:    He has no cervical adenopathy.  Nursing note and vitals reviewed.      Assessment:     Dysuria    Plan:     Push fluids Discussed possibility of renal stone Pt will return when having sx for UA F/U prn

## 2015-03-25 ENCOUNTER — Other Ambulatory Visit: Payer: Self-pay | Admitting: Cardiovascular Disease

## 2015-04-14 ENCOUNTER — Inpatient Hospital Stay (HOSPITAL_COMMUNITY)
Admission: EM | Admit: 2015-04-14 | Discharge: 2015-04-15 | DRG: 313 | Disposition: A | Discharge status: Home / Self Care | Payer: Medicare Other | Attending: Cardiology | Admitting: Cardiology

## 2015-04-14 ENCOUNTER — Ambulatory Visit (INDEPENDENT_AMBULATORY_CARE_PROVIDER_SITE_OTHER): Payer: Medicare Other | Admitting: Family Medicine

## 2015-04-14 ENCOUNTER — Other Ambulatory Visit: Payer: Self-pay | Admitting: Cardiovascular Disease

## 2015-04-14 ENCOUNTER — Encounter (HOSPITAL_COMMUNITY): Payer: Self-pay | Admitting: Emergency Medicine

## 2015-04-14 ENCOUNTER — Encounter: Payer: Self-pay | Admitting: Family Medicine

## 2015-04-14 ENCOUNTER — Emergency Department (HOSPITAL_COMMUNITY): Payer: Medicare Other

## 2015-04-14 VITALS — BP 124/72 | HR 60 | Temp 97.7°F | Ht 64.0 in | Wt 155.0 lb

## 2015-04-14 DIAGNOSIS — I351 Nonrheumatic aortic (valve) insufficiency: Secondary | ICD-10-CM | POA: Diagnosis present

## 2015-04-14 DIAGNOSIS — Z79899 Other long term (current) drug therapy: Secondary | ICD-10-CM

## 2015-04-14 DIAGNOSIS — Z86010 Personal history of colonic polyps: Secondary | ICD-10-CM | POA: Diagnosis not present

## 2015-04-14 DIAGNOSIS — R002 Palpitations: Secondary | ICD-10-CM | POA: Diagnosis not present

## 2015-04-14 DIAGNOSIS — I4510 Unspecified right bundle-branch block: Secondary | ICD-10-CM | POA: Diagnosis present

## 2015-04-14 DIAGNOSIS — F411 Generalized anxiety disorder: Secondary | ICD-10-CM | POA: Diagnosis present

## 2015-04-14 DIAGNOSIS — R001 Bradycardia, unspecified: Secondary | ICD-10-CM | POA: Diagnosis present

## 2015-04-14 DIAGNOSIS — I10 Essential (primary) hypertension: Secondary | ICD-10-CM | POA: Diagnosis not present

## 2015-04-14 DIAGNOSIS — R0789 Other chest pain: Secondary | ICD-10-CM | POA: Diagnosis not present

## 2015-04-14 DIAGNOSIS — Z952 Presence of prosthetic heart valve: Secondary | ICD-10-CM | POA: Diagnosis not present

## 2015-04-14 DIAGNOSIS — Z7982 Long term (current) use of aspirin: Secondary | ICD-10-CM | POA: Diagnosis not present

## 2015-04-14 DIAGNOSIS — R200 Anesthesia of skin: Secondary | ICD-10-CM | POA: Diagnosis present

## 2015-04-14 DIAGNOSIS — Z8249 Family history of ischemic heart disease and other diseases of the circulatory system: Secondary | ICD-10-CM | POA: Diagnosis not present

## 2015-04-14 DIAGNOSIS — Z885 Allergy status to narcotic agent status: Secondary | ICD-10-CM

## 2015-04-14 DIAGNOSIS — Z886 Allergy status to analgesic agent status: Secondary | ICD-10-CM | POA: Diagnosis not present

## 2015-04-14 DIAGNOSIS — R1110 Vomiting, unspecified: Secondary | ICD-10-CM | POA: Diagnosis not present

## 2015-04-14 DIAGNOSIS — G894 Chronic pain syndrome: Secondary | ICD-10-CM | POA: Diagnosis present

## 2015-04-14 DIAGNOSIS — Z954 Presence of other heart-valve replacement: Secondary | ICD-10-CM

## 2015-04-14 DIAGNOSIS — K219 Gastro-esophageal reflux disease without esophagitis: Secondary | ICD-10-CM | POA: Diagnosis present

## 2015-04-14 DIAGNOSIS — Z888 Allergy status to other drugs, medicaments and biological substances status: Secondary | ICD-10-CM | POA: Diagnosis not present

## 2015-04-14 DIAGNOSIS — R072 Precordial pain: Secondary | ICD-10-CM | POA: Diagnosis not present

## 2015-04-14 DIAGNOSIS — R253 Fasciculation: Secondary | ICD-10-CM | POA: Diagnosis present

## 2015-04-14 DIAGNOSIS — R079 Chest pain, unspecified: Secondary | ICD-10-CM | POA: Diagnosis not present

## 2015-04-14 DIAGNOSIS — R110 Nausea: Secondary | ICD-10-CM

## 2015-04-14 LAB — CBC
HCT: 41.6 % (ref 39.0–52.0)
Hemoglobin: 14.6 g/dL (ref 13.0–17.0)
MCH: 30.7 pg (ref 26.0–34.0)
MCHC: 35.1 g/dL (ref 30.0–36.0)
MCV: 87.4 fL (ref 78.0–100.0)
PLATELETS: 180 10*3/uL (ref 150–400)
RBC: 4.76 MIL/uL (ref 4.22–5.81)
RDW: 13.5 % (ref 11.5–15.5)
WBC: 5.6 10*3/uL (ref 4.0–10.5)

## 2015-04-14 LAB — BASIC METABOLIC PANEL
Anion gap: 9 (ref 5–15)
BUN: 11 mg/dL (ref 6–20)
CHLORIDE: 101 mmol/L (ref 101–111)
CO2: 27 mmol/L (ref 22–32)
CREATININE: 0.71 mg/dL (ref 0.61–1.24)
Calcium: 9.5 mg/dL (ref 8.9–10.3)
GFR calc Af Amer: >60 mL/min (ref >60)
Glucose, Bld: 94 mg/dL (ref 65–99)
Potassium: 4.2 mmol/L (ref 3.5–5.1)
SODIUM: 137 mmol/L (ref 135–145)

## 2015-04-14 LAB — MAGNESIUM: Magnesium: 2.1 mg/dL (ref 1.7–2.4)

## 2015-04-14 LAB — I-STAT TROPONIN, ED: Troponin i, poc: 0 ng/mL (ref 0.00–0.08)

## 2015-04-14 LAB — TROPONIN I

## 2015-04-14 LAB — T4, FREE: FREE T4: 0.75 ng/dL (ref 0.61–1.12)

## 2015-04-14 LAB — TSH: TSH: 2.029 u[IU]/mL (ref 0.350–4.500)

## 2015-04-14 MED ORDER — ASPIRIN 300 MG RE SUPP
300.0000 mg | RECTAL | Status: AC
  Filled 2015-04-14: qty 1

## 2015-04-14 MED ORDER — MORPHINE SULFATE 2 MG/ML IJ SOLN: 2.0000 mg | INTRAMUSCULAR | Status: DC | PRN

## 2015-04-14 MED ORDER — SODIUM CHLORIDE 0.9 % IV SOLN: INTRAVENOUS | Status: DC

## 2015-04-14 MED ORDER — OLOPATADINE HCL 0.1 % OP SOLN
1.0000 [drp] | Freq: Two times a day (BID) | OPHTHALMIC | Status: DC
  Administered 2015-04-14 – 2015-04-15 (×2): 1 [drp] via OPHTHALMIC
  Filled 2015-04-14: qty 5

## 2015-04-14 MED ORDER — NITROGLYCERIN 2 % TD OINT
1.0000 [in_us] | TOPICAL_OINTMENT | Freq: Three times a day (TID) | TRANSDERMAL | Status: DC
  Administered 2015-04-14 – 2015-04-15 (×2): 1 [in_us] via TOPICAL
  Filled 2015-04-14: qty 30

## 2015-04-14 MED ORDER — NITROGLYCERIN 0.4 MG SL SUBL: 0.4000 mg | SUBLINGUAL_TABLET | SUBLINGUAL | Status: DC | PRN

## 2015-04-14 MED ORDER — ADULT MULTIVITAMIN W/MINERALS CH
1.0000 | ORAL_TABLET | Freq: Every day | ORAL | Status: DC
  Administered 2015-04-15: 1 via ORAL
  Filled 2015-04-14: qty 1

## 2015-04-14 MED ORDER — MORPHINE SULFATE 4 MG/ML IJ SOLN
4.0000 mg | Freq: Once | INTRAMUSCULAR | Status: AC
  Administered 2015-04-14: 4 mg via INTRAVENOUS
  Filled 2015-04-14: qty 1

## 2015-04-14 MED ORDER — METOPROLOL SUCCINATE 12.5 MG HALF TABLET
12.5000 mg | ORAL_TABLET | Freq: Every day | ORAL | Status: DC
Start: 2015-04-15 — End: 2015-04-15
  Administered 2015-04-15: 12.5 mg via ORAL
  Filled 2015-04-14: qty 1

## 2015-04-14 MED ORDER — SELENIUM 200 MCG PO TABS: 1.0000 | ORAL_TABLET | Freq: Every day | ORAL | Status: DC

## 2015-04-14 MED ORDER — ASPIRIN 81 MG PO CHEW
324.0000 mg | CHEWABLE_TABLET | ORAL | Status: AC
  Administered 2015-04-14: 324 mg via ORAL
  Filled 2015-04-14: qty 4

## 2015-04-14 MED ORDER — LISINOPRIL 10 MG PO TABS
10.0000 mg | ORAL_TABLET | Freq: Every day | ORAL | Status: DC
  Administered 2015-04-15: 10 mg via ORAL
  Filled 2015-04-14: qty 1

## 2015-04-14 MED ORDER — ZOLPIDEM TARTRATE 5 MG PO TABS: 5.0000 mg | ORAL_TABLET | Freq: Every evening | ORAL | Status: DC | PRN

## 2015-04-14 MED ORDER — ALPRAZOLAM 0.25 MG PO TABS: 0.2500 mg | ORAL_TABLET | Freq: Two times a day (BID) | ORAL | Status: DC | PRN

## 2015-04-14 MED ORDER — ASPIRIN EC 81 MG PO TBEC
81.0000 mg | DELAYED_RELEASE_TABLET | Freq: Every day | ORAL | Status: DC
  Administered 2015-04-15: 81 mg via ORAL
  Filled 2015-04-14: qty 1

## 2015-04-14 MED ORDER — PANTOPRAZOLE SODIUM 40 MG PO TBEC
40.0000 mg | DELAYED_RELEASE_TABLET | Freq: Two times a day (BID) | ORAL | Status: DC
  Administered 2015-04-14: 40 mg via ORAL
  Filled 2015-04-14: qty 1

## 2015-04-14 MED ORDER — HEPARIN SODIUM (PORCINE) 5000 UNIT/ML IJ SOLN
5000.0000 [IU] | Freq: Three times a day (TID) | INTRAMUSCULAR | Status: DC
  Administered 2015-04-14 – 2015-04-15 (×2): 5000 [IU] via SUBCUTANEOUS
  Filled 2015-04-14 (×4): qty 1

## 2015-04-14 MED ORDER — CYCLOSPORINE 0.05 % OP EMUL
1.0000 [drp] | Freq: Two times a day (BID) | OPHTHALMIC | Status: DC
  Administered 2015-04-15: 1 [drp] via OPHTHALMIC
  Filled 2015-04-14 (×3): qty 1

## 2015-04-14 MED ORDER — ACETAMINOPHEN 325 MG PO TABS: 650.0000 mg | ORAL_TABLET | ORAL | Status: DC | PRN

## 2015-04-14 MED ORDER — NIACIN 100 MG PO TABS
100.0000 mg | ORAL_TABLET | Freq: Every day | ORAL | Status: DC
  Administered 2015-04-15: 100 mg via ORAL
  Filled 2015-04-14 (×2): qty 1

## 2015-04-14 MED ORDER — ONDANSETRON HCL 4 MG/2ML IJ SOLN
4.0000 mg | Freq: Four times a day (QID) | INTRAMUSCULAR | Status: DC | PRN
  Administered 2015-04-15: 4 mg via INTRAVENOUS
  Filled 2015-04-14: qty 2

## 2015-04-14 MED ORDER — ONDANSETRON HCL 4 MG/2ML IJ SOLN
4.0000 mg | Freq: Once | INTRAMUSCULAR | Status: AC
  Administered 2015-04-14: 4 mg via INTRAVENOUS
  Filled 2015-04-14: qty 2

## 2015-04-14 MED ORDER — OMEGA-3-ACID ETHYL ESTERS 1 G PO CAPS
1.0000 g | ORAL_CAPSULE | Freq: Two times a day (BID) | ORAL | Status: DC
  Administered 2015-04-14 – 2015-04-15 (×2): 1 g via ORAL
  Filled 2015-04-14 (×3): qty 1
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
## 2015-04-14 NOTE — ED Provider Notes (Signed)
Assumed care from [facility] at shift change. Briefly 54 year old male here with chest pain.  Seen by PCP this morning and sent here for further evaluation. Workup here has been reassuring.  Plan:  Cardiology to evaluate patient.  Disposition per their recommendations.  Results for orders placed or performed during the hospital encounter of 56/81/27  Basic metabolic panel  Result Value Ref Range   Sodium 137 135 - 145 mmol/L   Potassium 4.2 3.5 - 5.1 mmol/L   Chloride 101 101 - 111 mmol/L   CO2 27 22 - 32 mmol/L   Glucose, Bld 94 65 - 99 mg/dL   BUN 11 6 - 20 mg/dL   Creatinine, Ser 0.71 0.61 - 1.24 mg/dL   Calcium 9.5 8.9 - 10.3 mg/dL   GFR calc non Af Amer >60 >60 mL/min   GFR calc Af Amer >60 >60 mL/min   Anion gap 9 5 - 15  CBC  Result Value Ref Range   WBC 5.6 4.0 - 10.5 K/uL   RBC 4.76 4.22 - 5.81 MIL/uL   Hemoglobin 14.6 13.0 - 17.0 g/dL   HCT 41.6 39.0 - 52.0 %   MCV 87.4 78.0 - 100.0 fL   MCH 30.7 26.0 - 34.0 pg   MCHC 35.1 30.0 - 36.0 g/dL   RDW 13.5 11.5 - 15.5 %   Platelets 180 150 - 400 K/uL  I-stat troponin, ED  Result Value Ref Range   Troponin i, poc 0.00 0.00 - 0.08 ng/mL   Comment 3           Dg Chest Port 1 View  04/14/2015   CLINICAL DATA:  Chest pain, palpitations and upper back/neck pain which shortness-of-breath 2 weeks.  EXAM: PORTABLE CHEST - 1 VIEW  COMPARISON:  07/03/2014  FINDINGS: Sternotomy wires unchanged. Prosthetic aortic valve unchanged. Lungs are adequately inflated without consolidation or effusion. Cardiomediastinal silhouette is within normal. Remainder of the exam is unchanged.  IMPRESSION: No active disease.   Electronically Signed   By: Marin Olp M.D.   On: 04/14/2015 15:19    Cardiology evaluated patient, elected to admit for cardiac r/o.  Patient remains stable.  Larene Pickett, PA-C 04/14/15 1819  Gareth Morgan, MD 04/15/15 [phone]
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
## 2015-04-14 NOTE — Progress Notes (Signed)
Subjective:    Patient ID: [name], male    DOB: 1960-11-23, 54 y.o.   MRN: [id number]  HPI Patient here today for chest pain/ palpitations that have been going on awhile, but have recently gotten worse. The patient has had the chest pressure and palpitations for several weeks but they've had gotten worse this morning and that is why he has come to the office. He's also has some nausea associated with this. He has a history of having had a stress test in December that was within normal limits. He does not describe the pressure related to any specific activity it is there whether he is active or it can come when he is not active. He has not had any vomiting. He has a history of having had an aortic valve replacement in the past.      Patient Active Problem List   Diagnosis Date Noted  . Multiple somatic complaints 09/26/2011  . Generalized anxiety disorder 03/14/2011  . DIZZINESS 10/05/2009  . HYPERTENSION, BENIGN 11/05/2008  . AORTIC VALVE DISORDERS 11/05/2008  . ENDOCARDITIS 11/05/2008  . VENTRICULAR TACHYCARDIA 11/05/2008  . CONGESTIVE HEART FAILURE UNSPECIFIED 11/05/2008  . GERD 11/05/2008  . CHEST PAIN-UNSPECIFIED 11/05/2008  . H/O aortic valve replacement 11/05/2008   Outpatient Encounter Prescriptions as of 04/14/2015  Medication Sig  . aspirin 81 MG tablet Take 81 mg by mouth daily.    . clonazePAM (KLONOPIN) 1 MG tablet Take 1 mg by mouth at bedtime.   . cycloSPORINE (RESTASIS) 0.05 % ophthalmic emulsion Place 1 drop into both eyes 2 (two) times daily.  . Garlic Oil 0865 MG CAPS Take 1 capsule by mouth daily.    Marland Kitchen lisinopril (PRINIVIL,ZESTRIL) 10 MG tablet TAKE ONE TABLET BY MOUTH ONE TIME DAILY  . metoprolol succinate (TOPROL-XL) 25 MG 24 hr tablet TAKE 1 TABLET BY MOUTH TWICE  A DAY WITH OR IMMEDIATELY FOLLOWING A MEA L  . MILK THISTLE PO Take 100 mg by mouth daily.  . Multiple Vitamin (MULTIVITAMIN) tablet Take 1 tablet by mouth daily.    . niacin 100 MG  tablet Take 100 mg by mouth daily with breakfast.    . NITROSTAT 0.4 MG SL tablet PLACE 1 TABLET (0.4 MG TOTAL) UNDER THE TONGUE EVERY 5 (FIVE) MINUTES AS NEEDED FOR CHEST PAIN.  Marland Kitchen olopatadine (PATANOL) 0.1 % ophthalmic solution Place 1 drop into both eyes daily.  . Olopatadine HCl (PATADAY) 0.2 % SOLN Place 1 drop into both eyes daily.  Marland Kitchen omeprazole (PRILOSEC) 40 MG capsule Take 1 capsule (40 mg total) by mouth daily.  . Selenium 200 MCG TABS Take 1 tablet by mouth daily.     No facility-administered encounter medications on file as of 04/14/2015.     Review of Systems  Constitutional: Negative.   HENT: Negative.   Eyes: Negative.   Respiratory: Positive for chest tightness and shortness of breath.   Cardiovascular: Positive for palpitations.  Gastrointestinal: Positive for nausea.  Endocrine: Negative.   Genitourinary: Negative.   Musculoskeletal: Negative.   Skin: Negative.   Allergic/Immunologic: Negative.   Neurological: Negative.   Hematological: Negative.   Psychiatric/Behavioral: Negative.        Objective:   Physical Exam  Constitutional: He is oriented to person, place, and time. He appears well-developed and well-nourished. No distress.  HENT:  Head: Normocephalic and atraumatic.  Eyes: Conjunctivae and EOM are normal. Pupils are equal, round, and reactive to light.  Neck: Normal range of motion.  Cardiovascular: Normal rate  and regular rhythm.  Exam reveals no gallop and no friction rub.   Murmur heard. Grade 3/6 systolic ejection murmur at 60/m  Pulmonary/Chest: Effort normal and breath sounds normal. No respiratory distress. He has no wheezes. He has no rales.  Abdominal: Soft. Bowel sounds are normal. He exhibits no distension. There is no tenderness. There is no rebound and no guarding.  Musculoskeletal: Normal range of motion. He exhibits no edema.  Neurological: He is alert and oriented to person, place, and time.  Skin: Skin is warm and dry. No rash noted.    Psychiatric: He has a normal mood and affect. His behavior is normal. Thought content normal.  Nursing note and vitals reviewed.  BP 124/72 mmHg  Pulse 60  Temp(Src) 97.7 F (36.5 C) (Oral)  SpO2 99%  EKG: Normal sinus rhythm with right bundle branch block--no significant change from EKG done in November 2015 I spoke with the cardiologist in this patient has had multiple workups for chest pain and chest tightness and nothing has been found in the past. However because of his presentation today with increasing episodes of chest pressure and nausea it was recommended that we send him to the emergency room so he can have troponins checked to make sure that this is not an acute event trying to happen.      Assessment & Plan:  1. Palpitations -EKG was unchanged from previous EKG with right bundle branch block and normal sinus rhythm - EKG 12-Lead  2. Chest pressure -Because of the history of chest pressure occurring with increased frequency episode and nausea we will send the patient to the emergency room to have troponins monitored to make sure this is not an acute event  3. H/O aortic valve replacement  4. Nausea  Patient Instructions  The results of the EKG were discussed with the patient and his spouse. After discussion with the cardiologist it was felt that the best direction to follow would be to have him sent to the emergency room and have serial troponins monitored to make sure that this is not a cardiac event occurring. This was discussed with the patient.    Arrie Senate MD
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
## 2015-04-14 NOTE — Patient Instructions (Signed)
The results of the EKG were discussed with the patient and his spouse. After discussion with the cardiologist it was felt that the best direction to follow would be to have him sent to the emergency room and have serial troponins monitored to make sure that this is not a cardiac event occurring. This was discussed with the patient.

## 2015-04-15 ENCOUNTER — Inpatient Hospital Stay (HOSPITAL_COMMUNITY): Payer: Medicare Other

## 2015-04-15 DIAGNOSIS — R072 Precordial pain: Secondary | ICD-10-CM

## 2015-04-15 DIAGNOSIS — R0789 Other chest pain: Secondary | ICD-10-CM | POA: Diagnosis not present

## 2015-04-15 DIAGNOSIS — R001 Bradycardia, unspecified: Secondary | ICD-10-CM | POA: Diagnosis not present

## 2015-04-15 DIAGNOSIS — I351 Nonrheumatic aortic (valve) insufficiency: Secondary | ICD-10-CM | POA: Diagnosis not present

## 2015-04-15 DIAGNOSIS — R002 Palpitations: Secondary | ICD-10-CM | POA: Diagnosis not present

## 2015-04-15 DIAGNOSIS — R079 Chest pain, unspecified: Secondary | ICD-10-CM | POA: Diagnosis not present

## 2015-04-15 DIAGNOSIS — I10 Essential (primary) hypertension: Secondary | ICD-10-CM | POA: Diagnosis not present

## 2015-04-15 DIAGNOSIS — I4510 Unspecified right bundle-branch block: Secondary | ICD-10-CM | POA: Diagnosis not present

## 2015-04-15 LAB — LIPID PANEL
CHOL/HDL RATIO: 4.4 ratio
CHOLESTEROL: 222 mg/dL — AB (ref 0–200)
HDL: 51 mg/dL (ref >40)
LDL CALC: 152 mg/dL — AB (ref 0–99)
Triglycerides: 96 mg/dL (ref <150)
VLDL: 19 mg/dL (ref 0–40)

## 2015-04-15 LAB — BASIC METABOLIC PANEL
Anion gap: 8 (ref 5–15)
BUN: 12 mg/dL (ref 6–20)
CO2: 29 mmol/L (ref 22–32)
CREATININE: 0.86 mg/dL (ref 0.61–1.24)
Calcium: 9.4 mg/dL (ref 8.9–10.3)
Chloride: 101 mmol/L (ref 101–111)
GFR calc Af Amer: >60 mL/min (ref >60)
GFR calc non Af Amer: >60 mL/min (ref >60)
Glucose, Bld: 114 mg/dL — ABNORMAL HIGH (ref 65–99)
Potassium: 4.2 mmol/L (ref 3.5–5.1)
SODIUM: 138 mmol/L (ref 135–145)

## 2015-04-15 LAB — TROPONIN I: Troponin I: <0.03 ng/mL (ref <0.031)

## 2015-04-15 LAB — HEMOGLOBIN A1C
Hgb A1c MFr Bld: 5.7 % — ABNORMAL HIGH (ref 4.8–5.6)
Mean Plasma Glucose: 117 mg/dL

## 2015-04-15 LAB — CBC
HCT: 41.9 % (ref 39.0–52.0)
Hemoglobin: 14.9 g/dL (ref 13.0–17.0)
MCH: 31.2 pg (ref 26.0–34.0)
MCHC: 35.6 g/dL (ref 30.0–36.0)
MCV: 87.7 fL (ref 78.0–100.0)
PLATELETS: 175 10*3/uL (ref 150–400)
RBC: 4.78 MIL/uL (ref 4.22–5.81)
RDW: 13.9 % (ref 11.5–15.5)
WBC: 8 10*3/uL (ref 4.0–10.5)

## 2015-04-15 MED ORDER — REGADENOSON 0.4 MG/5ML IV SOLN
INTRAVENOUS | Status: AC
  Administered 2015-04-15: 0.4 mg via INTRAVENOUS
  Filled 2015-04-15: qty 5

## 2015-04-15 MED ORDER — TECHNETIUM TC 99M SESTAMIBI GENERIC - CARDIOLITE
10.0000 | Freq: Once | INTRAVENOUS | Status: AC | PRN
  Administered 2015-04-15: 10 via INTRAVENOUS

## 2015-04-15 MED ORDER — METOPROLOL SUCCINATE ER 25 MG PO TB24: 12.5000 mg | ORAL_TABLET | Freq: Every day | ORAL | Status: DC

## 2015-04-15 MED ORDER — TECHNETIUM TC 99M SESTAMIBI GENERIC - CARDIOLITE
30.0000 | Freq: Once | INTRAVENOUS | Status: AC | PRN
  Administered 2015-04-15: 30 via INTRAVENOUS

## 2015-04-15 MED ORDER — REGADENOSON 0.4 MG/5ML IV SOLN
0.4000 mg | Freq: Once | INTRAVENOUS | Status: AC
  Administered 2015-04-15: 0.4 mg via INTRAVENOUS
  Filled 2015-04-15: qty 5

## 2015-04-15 NOTE — Progress Notes (Signed)
Lexiscan myoview completed. Had significant vomiting during the test. Pending final result by [facility]  Signed, Almyra Deforest PA Pager: [phone]

## 2015-04-15 NOTE — Progress Notes (Signed)
D/c paperwork reviewed with the patient.  H/L and Monitor removed.  The patient asked questions about his condition.  He will be brought to the front door via W/C.

## 2015-04-15 NOTE — Progress Notes (Signed)
Patient Name: [name] Date of Encounter: 04/15/2015  Primary Cardiologist: Dr. Burt Knack   Principal Problem:   Chest pain Active Problems:   HYPERTENSION, BENIGN   H/O aortic valve replacement   Generalized anxiety disorder    SUBJECTIVE  Denies any CP or SOB. Had some mild chest discomfort last night. Seen during lexiscan myoview, had significant vomiting last lexiscan injection  CURRENT MEDS . aspirin EC  81 mg Oral Daily  . cycloSPORINE  1 drop Both Eyes BID  . heparin  5,000 Units Subcutaneous 3 times per day  . lisinopril  10 mg Oral Daily  . metoprolol succinate  12.5 mg Oral Daily  . multivitamin with minerals  1 tablet Oral Daily  . niacin  100 mg Oral Q breakfast  . nitroGLYCERIN  1 inch Topical 3 times per day  . olopatadine  1 drop Both Eyes BID  . omega-3 acid ethyl esters  1 g Oral BID  . pantoprazole  40 mg Oral BID    OBJECTIVE  Filed Vitals:   04/14/15 1730 04/14/15 1900 04/14/15 2104 04/15/15 0530  BP: 114/78 134/80 120/77 119/85  Pulse: 56 60 59 57  Temp:   97.9 F (36.6 C) 97.9 F (36.6 C)  TempSrc:   Oral Oral  Resp: 19 20 20 20   Height:  5\' 3"  (1.6 m)    Weight:  155 lb 11.2 oz (70.625 kg)    SpO2: 97% 100% 99% 98%    Intake/Output Summary (Last 24 hours) at 04/15/15 0921 Last data filed at 04/15/15 0500  Gross per 24 hour  Intake    100 ml  Output      4 ml  Net     96 ml   Filed Weights   04/14/15 1423 04/14/15 1900  Weight: 160 lb (72.576 kg) 155 lb 11.2 oz (70.625 kg)    PHYSICAL EXAM  General: Pleasant, NAD. Neuro: Alert and oriented X 3. Moves all extremities spontaneously. Psych: Normal affect. HEENT:  Normal  Neck: Supple without bruits or JVD. Lungs:  Resp regular and unlabored, CTA. Heart: RRR no s3, s4, or murmurs. Abdomen: Soft, non-tender, non-distended, BS + x 4.  Extremities: No clubbing, cyanosis or edema. DP/PT/Radials 2+ and equal bilaterally.  Accessory Clinical Findings  CBC  Recent Labs  04/14/15 1435 04/15/15 0733  WBC 5.6 8.0  HGB 14.6 14.9  HCT 41.6 41.9  MCV 87.4 87.7  PLT 180 245   Basic Metabolic Panel  Recent Labs  04/14/15 1435 04/14/15 2013 04/15/15 0733  NA 137  --  138  K 4.2  --  4.2  CL 101  --  101  CO2 27  --  29  GLUCOSE 94  --  114*  BUN 11  --  12  CREATININE 0.71  --  0.86  CALCIUM 9.5  --  9.4  MG  --  2.1  --    Cardiac Enzymes  Recent Labs  04/14/15 2013 04/15/15 0107 04/15/15 0733  TROPONINI <0.03 <0.03 <0.03   Hemoglobin A1C  Recent Labs  04/14/15 2013  HGBA1C 5.7*   Fasting Lipid Panel  Recent Labs  04/15/15 0107  CHOL 222*  HDL 51  LDLCALC 152*  TRIG 96  CHOLHDL 4.4   Thyroid Function Tests  Recent Labs  04/14/15 2013  TSH 2.029    TELE NSR without ventricular ectopy    ECG  NSR with RBBB  Echocardiogram 07/23/2014  LV EF: 65% -  70%  -------------------------------------------------------------------  Indications:   Chest pain (R07.9). Aortic valve replacement (Z95.4).  ------------------------------------------------------------------- History:  PMH: Acquired from the patient and from the patient&'s chart. Chest pain. Ventricular tachycardia. Risk factors: Hypertension.  ------------------------------------------------------------------- Study Conclusions  - Left ventricle: The cavity size was normal. There was mild concentric hypertrophy. Systolic function was vigorous. The estimated ejection fraction was in the range of 65% to 70%. Wall motion was normal; there were no regional wall motion abnormalities. Doppler parameters are consistent with abnormal left ventricular relaxation (grade 1 diastolic dysfunction). There was no evidence of elevated ventricular filling pressure by Doppler parameters. - Aortic valve: A bioprosthetic valve sits well in the aortic position. There is no central insufficiency and only trace paravalvular leak. Mean transaortic  gradient is 15 mmHg and is normal for this type of prosthesis. Mean gradient (S): 15 mm Hg. Peak gradient (S): 30 mm Hg. - Aortic root: The aortic root was mildly dilated measuring 41 mm. - Ascending aorta: The ascending aorta was normal in size. - Mitral valve: Structurally normal valve. There was no regurgitation. - Left atrium: The atrium was normal in size. - Right ventricle: Systolic function was normal. - Tricuspid valve: There was mild regurgitation. - Pulmonic valve: There was trivial regurgitation. - Pulmonary arteries: Systolic pressure was within the normal range.    Radiology/Studies  Dg Chest Port 1 View  04/14/2015   CLINICAL DATA:  Chest pain, palpitations and upper back/neck pain which shortness-of-breath 2 weeks.  EXAM: PORTABLE CHEST - 1 VIEW  COMPARISON:  07/03/2014  FINDINGS: Sternotomy wires unchanged. Prosthetic aortic valve unchanged. Lungs are adequately inflated without consolidation or effusion. Cardiomediastinal silhouette is within normal. Remainder of the exam is unchanged.  IMPRESSION: No active disease.   Electronically Signed   By: Marin Olp M.D.   On: 04/14/2015 15:19    ASSESSMENT AND PLAN  54 yo male with h/o bioprosthetic AVR 2007 due to AR and endocarditis, multiple somatic complaints undergone extensive testing demonstrating no major abnormalities presented with CP.   1. Atypical chest pain with h/o chronic pain syndrome  - serial trop negative, EKG shows chronic RBBB, pending lexiscan myoview this morning  2. HTN 3. H/o bioprosthetic AVR 4. Generalized anxiety disorder  Signed, Woodward Ku Pager: [phone]  I have personally seen and examined this patient with Almyra Deforest, PA-C. I agree with the assessment and plan as outlined above. He has atypical chest pain. Troponin negative. Stress myoview pending. If no large areas of ischemia, will d/c home today.   Aashi Derrington 04/15/2015 11:00 AM

## 2015-04-15 NOTE — Discharge Instructions (Signed)

## 2015-04-15 NOTE — Discharge Summary (Signed)
Discharge Summary   Patient ID: [name],  MRN: [id number], DOB/AGE: [birth year] 54 y.o.  Admit date: 04/14/2015 Discharge date: 04/15/2015  Primary Care Provider: [name] Primary Cardiologist: Dr. Burt Knack  Discharge Diagnoses Principal Problem:   Chest pain Active Problems:   HYPERTENSION, BENIGN   H/O aortic valve replacement   Generalized anxiety disorder   Allergies Allergies  Allergen Reactions  . Ibuprofen Other (See Comments)    Rectal bleeding  . Hydrocodone Nausea And Vomiting and Other (See Comments)    Sweating also  . Lyrica [Pregabalin] Swelling    Fluid retention  . Acetaminophen Rash  . Citalopram Other (See Comments)    unknown  . Gabapentin Itching  . Statins Other (See Comments)    unknown    Procedures  Lexiscan myoview 04/15/2015 IMPRESSION: 1. No reversible ischemia or infarction.  2. Normal left ventricular wall motion.  3. Left ventricular ejection fraction 65%  4. Low-risk stress test findings*.    Hospital Course  The patient is a 54 year old male with history of bioprosthetic aortic valve replacement in 2007 due to aortic insufficiency and endocarditis, multiple documented somatic complaints over the years with extensive workup demonstrating no major abnormalities. He has past cardiac CT demonstrated no CAD as part of his workup for SBE/valve replacement. He has problem with alcohol. Other testing included an echocardiogram, outpatient telemetry monitoring, cardiac catheterization all which has been unrevealing. He presented to [facility] on [facility]/14/2015 for complaint of chest pain and palpitation. He described as more chest discomfort that his chronic chest pain.   He was admitted to cardiology service for overnight observation. EKG demonstrated a chronic right bundle branch block which is old. Overnight, serial troponin was negative. He underwent Lexiscan stress test in the morning of 04/15/2015 which showed EF 65%,  no ischemia. He is deemed stable for discharge from cardiology perspective. He was updated on myoview result, he was asymptomatic at which time. He has a previously scheduled follow-up with Dr. Burt Knack. Of note, during this admission, because of bradycardia, his metoprolol succinate 25 mg twice a day has been changed to 12.5 mg twice a day.  Discharge Vitals Blood pressure 133/82, pulse 57, temperature 97.5 F (36.4 C), temperature source Oral, resp. rate 18, height 5\' 3"  (1.6 m), weight 155 lb 11.2 oz (70.625 kg), SpO2 100 %.  Filed Weights   04/14/15 1423 04/14/15 1900  Weight: 160 lb (72.576 kg) 155 lb 11.2 oz (70.625 kg)    Labs  CBC  Recent Labs  04/14/15 1435 04/15/15 0733  WBC 5.6 8.0  HGB 14.6 14.9  HCT 41.6 41.9  MCV 87.4 87.7  PLT 180 174   Basic Metabolic Panel  Recent Labs  04/14/15 1435 04/14/15 2013 04/15/15 0733  NA 137  --  138  K 4.2  --  4.2  CL 101  --  101  CO2 27  --  29  GLUCOSE 94  --  114*  BUN 11  --  12  CREATININE 0.71  --  0.86  CALCIUM 9.5  --  9.4  MG  --  2.1  --    Cardiac Enzymes  Recent Labs  04/14/15 2013 04/15/15 0107 04/15/15 0733  TROPONINI <0.03 <0.03 <0.03   Hemoglobin A1C  Recent Labs  04/14/15 2013  HGBA1C 5.7*   Fasting Lipid Panel  Recent Labs  04/15/15 0107  CHOL 222*  HDL 51  LDLCALC 152*  TRIG 96  CHOLHDL 4.4   Thyroid Function Tests  Recent Labs  04/14/15 2013  TSH 2.029    Disposition  Pt is being discharged home today in good condition.  Follow-up Plans & Appointments      Follow-up Information    Follow up with Sherren Mocha, MD On 05/19/2015.   Specialty:  Cardiology   Why:  3:45pm   Contact information:   [address] [address] [phone]       Discharge Medications    Medication List    TAKE these medications        aspirin 81 MG tablet  Take 81 mg by mouth daily.     B-12 PO  Take 1 tablet by mouth daily.     cycloSPORINE  0.05 % ophthalmic emulsion  Commonly known as:  RESTASIS  Place 1 drop into both eyes 2 (two) times daily.     FISH OIL PO  Take 1 capsule by mouth 2 (two) times daily.     Garlic Oil 2800 MG Caps  Take 1 capsule by mouth daily.     lisinopril 10 MG tablet  Commonly known as:  PRINIVIL,ZESTRIL  TAKE ONE TABLET BY MOUTH ONE TIME DAILY     metoprolol succinate 25 MG 24 hr tablet  Commonly known as:  TOPROL-XL  Take 0.5 tablets (12.5 mg total) by mouth daily.     MILK THISTLE PO  Take 100 mg by mouth daily.     multivitamin tablet  Take 1 tablet by mouth daily.     niacin 100 MG tablet  Take 100 mg by mouth daily with breakfast.     NITROSTAT 0.4 MG SL tablet  Generic drug:  nitroGLYCERIN  PLACE 1 TABLET (0.4 MG TOTAL) UNDER THE TONGUE EVERY 5 (FIVE) MINUTES AS NEEDED FOR CHEST PAIN.     Olopatadine HCl 0.2 % Soln  Commonly known as:  PATADAY  Place 1 drop into both eyes daily.     omeprazole 40 MG capsule  Commonly known as:  PRILOSEC  Take 1 capsule (40 mg total) by mouth daily.     Selenium 200 MCG Tabs  Take 1 tablet by mouth daily.        Duration of Discharge Encounter   Greater than 30 minutes including physician time.  Hilbert Corrigan PA-C Pager: [phone] 04/15/2015, 5:28 PM

## 2015-05-19 ENCOUNTER — Encounter: Payer: Self-pay | Admitting: Cardiovascular Disease

## 2015-05-19 ENCOUNTER — Ambulatory Visit (INDEPENDENT_AMBULATORY_CARE_PROVIDER_SITE_OTHER): Payer: Medicare Other | Admitting: Cardiovascular Disease

## 2015-05-19 VITALS — BP 120/70 | HR 70 | Ht 63.0 in | Wt 158.2 lb

## 2015-05-19 DIAGNOSIS — I359 Nonrheumatic aortic valve disorder, unspecified: Secondary | ICD-10-CM | POA: Diagnosis not present

## 2015-05-19 NOTE — Patient Instructions (Signed)
Medication Instructions:  Your physician recommends that you continue on your current medications as directed. Please refer to the Current Medication list given to you today.   Labwork: none  Testing/Procedures: none  Follow-Up: Your physician wants you to follow-up in:  12 months.  You will receive a reminder letter in the mail two months in advance. If you don't receive a letter, please call our office to schedule the follow-up appointment.        
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
## 2015-06-07 ENCOUNTER — Other Ambulatory Visit: Payer: Self-pay | Admitting: Cardiovascular Disease

## 2015-06-14 ENCOUNTER — Other Ambulatory Visit: Payer: Self-pay | Admitting: Cardiovascular Disease

## 2015-06-21 ENCOUNTER — Ambulatory Visit: Payer: Medicare Other | Admitting: Family Medicine

## 2015-06-21 ENCOUNTER — Encounter: Payer: Self-pay | Admitting: Family Medicine

## 2015-06-21 ENCOUNTER — Ambulatory Visit (INDEPENDENT_AMBULATORY_CARE_PROVIDER_SITE_OTHER): Payer: Medicare Other | Admitting: Family Medicine

## 2015-06-21 VITALS — Temp 96.7°F | Ht 63.0 in | Wt 158.4 lb

## 2015-06-21 DIAGNOSIS — Z23 Encounter for immunization: Secondary | ICD-10-CM

## 2015-06-21 DIAGNOSIS — R350 Frequency of micturition: Secondary | ICD-10-CM

## 2015-06-21 LAB — POCT URINALYSIS DIPSTICK
BILIRUBIN UA: NEGATIVE
Glucose, UA: NEGATIVE
Ketones, UA: NEGATIVE
Leukocytes, UA: NEGATIVE
Nitrite, UA: NEGATIVE
Protein, UA: NEGATIVE
RBC UA: NEGATIVE
SPEC GRAV UA: 1.015
Urobilinogen, UA: NEGATIVE
pH, UA: 6.5

## 2015-06-21 LAB — POCT UA - MICROSCOPIC ONLY
Bacteria, U Microscopic: NEGATIVE
CASTS, UR, LPF, POC: NEGATIVE
CRYSTALS, UR, HPF, POC: NEGATIVE
RBC, urine, microscopic: NEGATIVE
WBC, Ur, HPF, POC: NEGATIVE
YEAST UA: NEGATIVE

## 2015-06-21 MED ORDER — CIPROFLOXACIN HCL 250 MG PO TABS: 250.0000 mg | ORAL_TABLET | Freq: Two times a day (BID) | ORAL | Status: DC

## 2015-06-21 NOTE — Progress Notes (Signed)
   Subjective:    Patient ID: [name], male    DOB: 04/09/1961, 54 y.o.   MRN: [id number]  HPI 54 year old gentleman here with chief complaint of back pain. Also having urinary frequency mostly at night but also in the daytime. He does not have diabetes. There is no associated dysuria. The back pain is generalized to both sides as well as in the middle both upper and lower back. He also complains today of some symptoms of somewhat neuropathy but he cannot take medicines including Lyrica and gabapentin.  Patient Active Problem List   Diagnosis Date Noted  . Multiple somatic complaints 09/26/2011  . Generalized anxiety disorder 03/14/2011  . DIZZINESS 10/05/2009  . HYPERTENSION, BENIGN 11/05/2008  . AORTIC VALVE DISORDERS 11/05/2008  . ENDOCARDITIS 11/05/2008  . VENTRICULAR TACHYCARDIA 11/05/2008  . CONGESTIVE HEART FAILURE UNSPECIFIED 11/05/2008  . GERD 11/05/2008  . Chest pain 11/05/2008  . H/O aortic valve replacement 11/05/2008   Outpatient Encounter Prescriptions as of 06/21/2015  Medication Sig  . aspirin 81 MG tablet Take 81 mg by mouth daily.    . Cyanocobalamin (B-12 PO) Take 1 tablet by mouth daily.  . cycloSPORINE (RESTASIS) 0.05 % ophthalmic emulsion Place 1 drop into both eyes 2 (two) times daily.  . Garlic Oil 3149 MG CAPS Take 1 capsule by mouth daily.    Marland Kitchen lisinopril (PRINIVIL,ZESTRIL) 10 MG tablet TAKE ONE TABLET BY MOUTH ONE TIME DAILY  . metoprolol succinate (TOPROL-XL) 25 MG 24 hr tablet TAKE 1 TABLET BY MOUTH TWICE A DAY WITH OR IMMEDIATELY FOLLOWING A MEAL  . MILK THISTLE PO Take 100 mg by mouth daily.  . Multiple Vitamin (MULTIVITAMIN) tablet Take 1 tablet by mouth daily.    . niacin 100 MG tablet Take 100 mg by mouth daily with breakfast.    . NITROSTAT 0.4 MG SL tablet PLACE 1 TABLET (0.4 MG TOTAL) UNDER THE TONGUE EVERY 5 (FIVE) MINUTES AS NEEDED FOR CHEST PAIN.  Marland Kitchen Olopatadine HCl (PATADAY) 0.2 % SOLN Place 1 drop into both eyes daily.  . Omega-3  Fatty Acids (FISH OIL PO) Take 1 capsule by mouth 2 (two) times daily.  . Selenium 200 MCG TABS Take 1 tablet by mouth daily.     No facility-administered encounter medications on file as of 06/21/2015.       Review of Systems  Constitutional: Negative.   Respiratory: Negative.   Cardiovascular: Negative.   Endocrine: Positive for polyuria.  Genitourinary: Positive for frequency.  Neurological: Negative.   Psychiatric/Behavioral: Negative.        Objective:   Physical Exam  Constitutional: He appears well-developed and well-nourished.  Cardiovascular: Normal rate and regular rhythm.   Pulmonary/Chest: Effort normal and breath sounds normal.  Abdominal: Soft.  Psychiatric: He has a normal mood and affect. Thought content normal.          Assessment & Plan:  1. Urinary frequency Urine is basically unremarkable. Prostate exam shows no tenderness but there is some enlargement and it is soft to exam. Will try one-week course of Cipro. Patient to do follow-up phone call on how he's feeling specifically urinary frequency - POCT UA - Microscopic Only  [name] MD - POCT urinalysis dipstick

## 2015-07-09 ENCOUNTER — Other Ambulatory Visit: Payer: Self-pay | Admitting: Family Medicine

## 2015-07-09 NOTE — Telephone Encounter (Signed)
Patient called stating that Rx did help with prostate, and is feeling better but would like another refill on antibiotic.

## 2015-07-13 ENCOUNTER — Other Ambulatory Visit: Payer: Self-pay | Admitting: *Deleted

## 2015-07-13 ENCOUNTER — Telehealth: Payer: Self-pay | Admitting: Family Medicine

## 2015-07-13 MED ORDER — CIPROFLOXACIN HCL 500 MG PO TABS: 500.0000 mg | ORAL_TABLET | Freq: Two times a day (BID) | ORAL | Status: DC

## 2015-07-13 NOTE — Telephone Encounter (Signed)
Rx sent to pharmacy. [facility].

## 2015-07-13 NOTE — Telephone Encounter (Signed)
Refill Cipro 500 mg BIB # 20

## 2015-07-13 NOTE — Telephone Encounter (Signed)
Patient states that 2 days after antibiotic the burning when urination went away and the 2 days after stopping the medication the burning started back. Please advise

## 2015-09-01 ENCOUNTER — Ambulatory Visit (INDEPENDENT_AMBULATORY_CARE_PROVIDER_SITE_OTHER): Payer: Medicare Other | Admitting: Family Medicine

## 2015-09-01 ENCOUNTER — Encounter: Payer: Self-pay | Admitting: Family Medicine

## 2015-09-01 VITALS — BP 113/73 | HR 69 | Temp 97.4°F | Ht 63.0 in | Wt 159.0 lb

## 2015-09-01 DIAGNOSIS — I5020 Unspecified systolic (congestive) heart failure: Secondary | ICD-10-CM | POA: Diagnosis not present

## 2015-09-01 DIAGNOSIS — F411 Generalized anxiety disorder: Secondary | ICD-10-CM | POA: Diagnosis not present

## 2015-09-01 DIAGNOSIS — I10 Essential (primary) hypertension: Secondary | ICD-10-CM

## 2015-09-01 NOTE — Progress Notes (Signed)
   Subjective:    Patient ID: [name], male    DOB: 1961-03-03, 54 y.o.   MRN: [id number]  HPI  54 year old gentleman with multiple complaints. Scheduled him build is prostate problems but he denies that. He does have some nocturia especially when he drinks lots of liquids between suppertime and bedtime. He also has some symptoms that suggest neuropathy in his feet and legs. There is back pain. There is no history of diabetes. Occasionally he gets some pains in the right upper quadrant. There is no nausea or vomiting. He also has some reflux sounding symptoms along with sore throat and cough.    Review of Systems  Constitutional: Negative.   Respiratory: Negative.   Cardiovascular: Negative.   Gastrointestinal: Positive for abdominal pain.  Genitourinary: Positive for frequency.  Neurological: Negative.   Psychiatric/Behavioral: Negative.       BP 113/73 mmHg  Pulse 69  Temp(Src) 97.4 F (36.3 C) (Oral)  Ht 5\' 3"  (1.6 m)  Wt 159 lb (72.122 kg)  BMI 28.17 kg/m2  Objective:   Physical Exam  Constitutional: He is oriented to person, place, and time. He appears well-developed and well-nourished.  HENT:  Head: Normocephalic.  Cardiovascular: Normal rate and regular rhythm.   Pulmonary/Chest: Effort normal and breath sounds normal.  Abdominal: Soft. There is no tenderness.  Musculoskeletal: Normal range of motion.  Neurological: He is alert and oriented to person, place, and time. He displays normal reflexes. He exhibits normal muscle tone.  Psychiatric: He has a normal mood and affect.          Assessment & Plan:  1. HYPERTENSION, BENIGN Blood pressures have been well controlled here. They report some higher pressures in the evening along with sweating and perhaps confusion. I suggested to check his blood sugar during those times he may be having some reactive hypoglycemia  2. Systolic congestive heart failure, unspecified congestive heart failure chronicity  (HCC) Are no symptoms of congestive heart failure. He is status post valve replacement.  3. Generalized anxiety disorder He seems a little bit anxious but symptoms are manageable on no medicines  [name] MD

## 2015-09-24 ENCOUNTER — Telehealth: Payer: Self-pay | Admitting: Family Medicine

## 2015-09-24 NOTE — Telephone Encounter (Signed)
Pharmacy was changed from [facility] to [facility].

## 2015-11-03 ENCOUNTER — Other Ambulatory Visit: Payer: Self-pay | Admitting: *Deleted

## 2015-11-03 MED ORDER — NITROGLYCERIN 0.4 MG SL SUBL: SUBLINGUAL_TABLET | SUBLINGUAL | Status: DC

## 2015-11-03 NOTE — Telephone Encounter (Signed)
Spoke with pt and received verbal consent to speak with Tammy Nance.

## 2016-01-03 ENCOUNTER — Other Ambulatory Visit: Payer: Self-pay | Admitting: *Deleted

## 2016-01-03 MED ORDER — METOPROLOL SUCCINATE ER 25 MG PO TB24: ORAL_TABLET | ORAL | Status: DC

## 2016-01-03 NOTE — Telephone Encounter (Signed)
nitroGLYCERIN (NITROSTAT) 0.4 MG SL tablet  Medication   Date: 11/03/2015  Department: [facility] [address] [facility]  Ordering/Authorizing: Sherren Mocha, MD      Order Providers    Prescribing Provider Encounter Provider   Sherren Mocha, MD Roberts Gaudy, CMA    Medication Detail      Disp Refills Start End     nitroGLYCERIN (NITROSTAT) 0.4 MG SL tablet 25 tablet 1 11/03/2015     Sig: PLACE 1 TABLET (0.4 MG TOTAL) UNDER THE TONGUE EVERY 5 (FIVE) MINUTES AS NEEDED FOR CHEST PAIN. 3 DOSES MAX.    E-Prescribing Status: Receipt confirmed by pharmacy (11/03/2015 1:00 PM EST)     Pharmacy    [place], [place]

## 2016-01-31 ENCOUNTER — Encounter: Payer: Self-pay | Admitting: Physician Assistant

## 2016-01-31 ENCOUNTER — Ambulatory Visit (INDEPENDENT_AMBULATORY_CARE_PROVIDER_SITE_OTHER): Payer: Medicare Other | Admitting: Physician Assistant

## 2016-01-31 ENCOUNTER — Encounter (INDEPENDENT_AMBULATORY_CARE_PROVIDER_SITE_OTHER): Payer: Self-pay

## 2016-01-31 VITALS — BP 128/86 | HR 61 | Temp 97.7°F | Ht 63.0 in | Wt 162.8 lb

## 2016-01-31 DIAGNOSIS — R358 Other polyuria: Secondary | ICD-10-CM | POA: Diagnosis not present

## 2016-01-31 DIAGNOSIS — K625 Hemorrhage of anus and rectum: Secondary | ICD-10-CM | POA: Diagnosis not present

## 2016-01-31 DIAGNOSIS — R3589 Other polyuria: Secondary | ICD-10-CM

## 2016-01-31 DIAGNOSIS — G5793 Unspecified mononeuropathy of bilateral lower limbs: Secondary | ICD-10-CM

## 2016-01-31 LAB — BAYER DCA HB A1C WAIVED: HB A1C (BAYER DCA - WAIVED): 5.3 % (ref <7.0)

## 2016-01-31 NOTE — Patient Instructions (Signed)
Neuropathic Pain Neuropathic pain is pain caused by damage to the nerves that are responsible for certain sensations in your body (sensory nerves). The pain can be caused by damage to:   The sensory nerves that send signals to your spinal cord and brain (peripheral nervous system).  The sensory nerves in your brain or spinal cord (central nervous system). Neuropathic pain can make you more sensitive to pain. What would be a minor sensation for most people may feel very painful if you have neuropathic pain. This is usually a long-term condition that can be difficult to treat. The type of pain can differ from person to person. It may start suddenly (acute), or it may develop slowly and last for a long time (chronic). Neuropathic pain may come and go as damaged nerves heal or may stay at the same level for years. It often causes emotional distress, loss of sleep, and a lower quality of life. CAUSES  The most common cause of damage to a sensory nerve is diabetes. Many other diseases and conditions can also cause neuropathic pain. Causes of neuropathic pain can be classified as:  Toxic. Many drugs and chemicals can cause toxic damage. The most common cause of toxic neuropathic pain is damage from drug treatment for cancer (chemotherapy).  Metabolic. This type of pain can happen when a disease causes imbalances that damage nerves. Diabetes is the most common of these diseases. Vitamin B deficiency caused by long-term alcohol abuse is another common cause.  Traumatic. Any injury that cuts, crushes, or stretches a nerve can cause damage and pain. A common example is feeling pain after losing an arm or leg (phantom limb pain).  Compression-related. If a sensory nerve gets trapped or compressed for a long period of time, the blood supply to the nerve can be cut off.  Vascular. Many blood vessel diseases can cause neuropathic pain by decreasing blood supply and oxygen to nerves.  Autoimmune. This type of  pain results from diseases in which the body's defense system mistakenly attacks sensory nerves. Examples of autoimmune diseases that can cause neuropathic pain include lupus and multiple sclerosis.  Infectious. Many types of viral infections can damage sensory nerves and cause pain. Shingles infection is a common cause of this type of pain.  Inherited. Neuropathic pain can be a symptom of many diseases that are passed down through families (genetic). SIGNS AND SYMPTOMS  The main symptom is pain. Neuropathic pain is often described as:  Burning.  Shock-like.  Stinging.  Hot or cold.  Itching. DIAGNOSIS  No single test can diagnose neuropathic pain. Your health care provider will do a physical exam and ask you about your pain. You may use a pain scale to describe how bad your pain is. You may also have tests to see if you have a high sensitivity to pain and to help find the cause and location of any sensory nerve damage. These tests may include:  Imaging studies, such as:  X-rays.  CT scan.  MRI.  Nerve conduction studies to test how well nerve signals travel through your sensory nerves (electrodiagnostic testing).  Stimulating your sensory nerves through electrodes on your skin and measuring the response in your spinal cord and brain (somatosensory evoked potentials). TREATMENT  Treatment for neuropathic pain may change over time. You may need to try different treatment options or a combination of treatments. Some options include:  Over-the-counter pain relievers.  Prescription medicines. Some medicines used to treat other conditions may also help neuropathic pain. These   include medicines to:  Control seizures (anticonvulsants).  Relieve depression (antidepressants).  Prescription-strength pain relievers (narcotics). These are usually used when other pain relievers do not help.  Transcutaneous nerve stimulation (TENS). This uses electrical currents to block painful nerve  signals. The treatment is painless.  Topical and local anesthetics. These are medicines that numb the nerves. They can be injected as a nerve block or applied to the skin.  Alternative treatments, such as:  Acupuncture.  Meditation.  Massage.  Physical therapy.  Pain management programs.  Counseling. HOME CARE INSTRUCTIONS  Learn as much as you can about your condition.  Take medicines only as directed by your health care provider.  Work closely with all your health care providers to find what works best for you.  Have a good support system at home.  Consider joining a chronic pain support group. SEEK MEDICAL CARE IF:  Your pain treatments are not helping.  You are having side effects from your medicines.  You are struggling with fatigue, mood changes, depression, or anxiety.   This information is not intended to replace advice given to you by your health care provider. Make sure you discuss any questions you have with your health care provider.   Document Released: 05/25/2004 Document Revised: 09/18/2014 Document Reviewed: 02/05/2014 Elsevier Interactive Patient Education 2016 [employer]  

## 2016-01-31 NOTE — Progress Notes (Signed)
Subjective:     Patient ID: [name], male   DOB: 1961/01/08, 55 y.o.   MRN: [id number]  HPI Pt here with multiple complaints #1- Pt has had chronic LBP and radicular leg pain He has been tried on multiple meds for these sx but has been unable to tolerate any of them He states the sx to the lowers legs has gotten worse He is unable to sit still for long since the sx improve with walking #2- Polyuria, feels he is urinating every hr #3- Rectal bleeding intermit Had colonoscopy with multiple polyps removed in the last yr F/U was to be in 3-5 yrs Informed he had hemorrhoids but has not used OTC meds  Review of Systems  Constitutional: Negative.   HENT: Negative.   Respiratory: Negative.   Cardiovascular: Negative for palpitations and leg swelling.  Genitourinary: Positive for frequency. Negative for dysuria, urgency, hematuria, flank pain, decreased urine volume and difficulty urinating.  Neurological: Negative.  Negative for weakness.       Objective:   Physical Exam  Constitutional: He appears well-developed and well-nourished.  HENT:  Mouth/Throat: Oropharyngeal exudate present.  Lymphadenopathy:    He has no cervical adenopathy.  Nursing note and vitals reviewed. No TTP of the L-spine FROM of the L-spine SLR neg Muscle strength equal in the lower ext DTR 2+/= lower ext CMP/AIC pending     Assessment:     1. Polyuria   2. Neuropathy involving both lower extremities (HCC)   3. Rectal bleeding        Plan:     Referral to Neuro for neuropathy sx since he has failed all tx here Will inform of lab results OTC meds for hemorrhoid sx F/U with Dr Sabra Heck for regular check

## 2016-02-01 LAB — CMP14+EGFR
A/G RATIO: 2.3 — AB (ref 1.2–2.2)
ALK PHOS: 86 IU/L (ref 39–117)
ALT: 27 IU/L (ref 0–44)
AST: 30 IU/L (ref 0–40)
Albumin: 4.8 g/dL (ref 3.5–5.5)
BILIRUBIN TOTAL: 0.9 mg/dL (ref 0.0–1.2)
BUN/Creatinine Ratio: 19 (ref 9–20)
BUN: 14 mg/dL (ref 6–24)
CHLORIDE: 100 mmol/L (ref 96–106)
CO2: 25 mmol/L (ref 18–29)
Calcium: 9.6 mg/dL (ref 8.7–10.2)
Creatinine, Ser: 0.75 mg/dL — ABNORMAL LOW (ref 0.76–1.27)
GFR calc non Af Amer: 104 mL/min/{1.73_m2} (ref >59)
GFR, EST AFRICAN AMERICAN: 120 mL/min/{1.73_m2} (ref >59)
GLUCOSE: 90 mg/dL (ref 65–99)
Globulin, Total: 2.1 g/dL (ref 1.5–4.5)
POTASSIUM: 4.3 mmol/L (ref 3.5–5.2)
Sodium: 142 mmol/L (ref 134–144)
TOTAL PROTEIN: 6.9 g/dL (ref 6.0–8.5)

## 2016-03-15 ENCOUNTER — Other Ambulatory Visit: Payer: Self-pay | Admitting: *Deleted

## 2016-03-15 MED ORDER — NITROGLYCERIN 0.4 MG SL SUBL
SUBLINGUAL_TABLET | SUBLINGUAL | Status: DC
Start: 2016-03-15 — End: 2016-08-01

## 2016-03-23 ENCOUNTER — Telehealth: Payer: Self-pay | Admitting: Cardiovascular Disease

## 2016-03-23 NOTE — Telephone Encounter (Signed)
New message   Pt wife is calling to schedule pt yearly ov with Dr.Cooper

## 2016-03-23 NOTE — Telephone Encounter (Signed)
Spoke with wife and informed her that Dr. Burt Knack is unfortunately booked out through Sept and that the last 3 months of the year are not unlocked at this time. Advised wife we can place pt on wait list, otherwise we have to wait for schedule to unlock.  Wife verbalized understanding.

## 2016-03-28 ENCOUNTER — Encounter: Payer: Self-pay | Admitting: Cardiovascular Disease

## 2016-03-28 ENCOUNTER — Other Ambulatory Visit: Payer: Self-pay | Admitting: Cardiovascular Disease

## 2016-03-28 MED ORDER — LISINOPRIL 10 MG PO TABS: 10.0000 mg | ORAL_TABLET | Freq: Every day | ORAL | Status: DC

## 2016-03-28 NOTE — Telephone Encounter (Signed)
This encounter was created in error - please disregard.

## 2016-03-28 NOTE — Telephone Encounter (Signed)
New message     The family member spoke with some-one she did not know who and called back to make appointment on August 3 rd but all the times are on hold, I was making sure this was right before I made the appointment cause the caller was confused

## 2016-03-28 NOTE — Telephone Encounter (Signed)
I left a message for Tammy to contact the office and schedule an appointment for the pt on 04/13/16 with Dr Burt Knack.

## 2016-03-28 NOTE — Telephone Encounter (Signed)
The pt is actually due for yearly follow-up in September so I have scheduled an appointment on first available date which is 06/19/16. Tammy aware.

## 2016-06-15 DIAGNOSIS — Z23 Encounter for immunization: Secondary | ICD-10-CM | POA: Diagnosis not present

## 2016-06-19 ENCOUNTER — Ambulatory Visit (INDEPENDENT_AMBULATORY_CARE_PROVIDER_SITE_OTHER): Payer: Medicare Other | Admitting: Pediatrics

## 2016-06-19 ENCOUNTER — Encounter: Payer: Self-pay | Admitting: Pediatrics

## 2016-06-19 ENCOUNTER — Ambulatory Visit (INDEPENDENT_AMBULATORY_CARE_PROVIDER_SITE_OTHER): Payer: Medicare Other | Admitting: Cardiovascular Disease

## 2016-06-19 ENCOUNTER — Encounter: Payer: Self-pay | Admitting: Cardiovascular Disease

## 2016-06-19 VITALS — BP 119/80 | HR 70 | Temp 98.0°F | Ht 63.0 in | Wt 159.0 lb

## 2016-06-19 VITALS — BP 118/70 | HR 56 | Ht 63.0 in | Wt 155.8 lb

## 2016-06-19 DIAGNOSIS — Z952 Presence of prosthetic heart valve: Secondary | ICD-10-CM

## 2016-06-19 DIAGNOSIS — R0683 Snoring: Secondary | ICD-10-CM | POA: Diagnosis not present

## 2016-06-19 DIAGNOSIS — R399 Unspecified symptoms and signs involving the genitourinary system: Secondary | ICD-10-CM | POA: Diagnosis not present

## 2016-06-19 DIAGNOSIS — I10 Essential (primary) hypertension: Secondary | ICD-10-CM | POA: Diagnosis not present

## 2016-06-19 DIAGNOSIS — R079 Chest pain, unspecified: Secondary | ICD-10-CM | POA: Diagnosis not present

## 2016-06-19 DIAGNOSIS — R0602 Shortness of breath: Secondary | ICD-10-CM

## 2016-06-19 DIAGNOSIS — I359 Nonrheumatic aortic valve disorder, unspecified: Secondary | ICD-10-CM

## 2016-06-19 DIAGNOSIS — R339 Retention of urine, unspecified: Secondary | ICD-10-CM

## 2016-06-19 LAB — URINALYSIS, COMPLETE
Bilirubin, UA: NEGATIVE
GLUCOSE, UA: NEGATIVE
LEUKOCYTES UA: NEGATIVE
NITRITE UA: NEGATIVE
Protein, UA: NEGATIVE
RBC, UA: NEGATIVE
SPEC GRAV UA: 1.015 (ref 1.005–1.030)
Urobilinogen, Ur: 0.2 mg/dL (ref 0.2–1.0)
pH, UA: 7.5 (ref 5.0–7.5)

## 2016-06-19 LAB — MICROSCOPIC EXAMINATION
Bacteria, UA: NONE SEEN
Epithelial Cells (non renal): NONE SEEN /hpf (ref 0–10)
RBC MICROSCOPIC, UA: NONE SEEN /HPF (ref 0–?)
WBC, UA: NONE SEEN /hpf (ref 0–?)

## 2016-06-19 MED ORDER — TAMSULOSIN HCL 0.4 MG PO CAPS: 0.4000 mg | ORAL_CAPSULE | Freq: Every day | ORAL | 3 refills | Status: DC

## 2016-06-19 NOTE — Progress Notes (Signed)
  Subjective:   Patient ID: [name], male    DOB: 30-Mar-1961, 55 y.o.   MRN: [id number] CC: Urinary Retention; Back Pain; and Abdominal Pain  HPI: [name] is a 55 y.o. male presenting for Urinary Retention; Back Pain; and Abdominal Pain  Past 2 weeks having trouble urinating Sometimes having some dribbling, not always Some low back pain Feels like he isnt able to empty his bladder all the time Sometimes he is Sometimes stream better No pain with urination or passing stool Feeling well, no fevers No appetite changes   Daytime sleepiness Night time snoring, sometimes very uneven Sometimes wakes himself up snoring, jumps out of bed  Going to bed 930-10pm Doesn't snore all the time   HTN: taking meds regularly No HA No morning headache regularly  Relevant past medical, surgical, family and social history reviewed. Allergies and medications reviewed and updated. History  Smoking Status  . Never Smoker  Smokeless Tobacco  . Never Used   ROS: Per HPI   Objective:    BP 119/80   Pulse 70   Temp 98 F (36.7 C) (Oral)   Ht _0  (1.6 m)   Wt 159 lb (72.1 kg)   BMI 28.17 kg/m   Wt Readings from Last 3 Encounters:  06/19/16 159 lb (72.1 kg)  06/19/16 155 lb 12.8 oz (70.7 kg)  01/31/16 162 lb 12.8 oz (73.8 kg)    Gen: NAD, alert, cooperative with exam, NCAT EYES: EOMI, no conjunctival injection, or no icterus ENT:   OP without erythema LYMPH: no cervical LAD CV: NRRR, loud S2, distal pulses 2+ b/l Resp: CTABL, no wheezes, normal WOB Abd: +BS, soft, NTND. no guarding or organomegaly Ext: No edema, warm Neuro: Alert and oriented, strength equal b/l UE and LE, coordination grossly normal MSK: normal muscle bulk  Assessment & Plan:  [name] was seen today for urinary symptoms.  Diagnoses and all orders for this visit:  UTI symptoms UA normal -     Urinalysis, Complete  Urinary retention BMP several months ago with normal Cr Trial  tamsulosin If still symptomatic refer to urology -     tamsulosin (FLOMAX) 0.4 MG CAPS capsule; Take 1 capsule (0.4 mg total) by mouth daily. -     BMP8+EGFR  HYPERTENSION, BENIGN Well controlled, cont current meds  Snoring Pt with daytime sleepiness Does not want sleep study at this time  Follow up plan: 2 mo Assunta Found, MD [place]
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
## 2016-06-19 NOTE — Patient Instructions (Signed)

## 2016-06-21 ENCOUNTER — Other Ambulatory Visit: Payer: Self-pay | Admitting: *Deleted

## 2016-06-21 MED ORDER — LISINOPRIL 10 MG PO TABS: 10.0000 mg | ORAL_TABLET | Freq: Every day | ORAL | 3 refills | Status: DC

## 2016-06-28 ENCOUNTER — Other Ambulatory Visit: Payer: Self-pay

## 2016-06-28 ENCOUNTER — Ambulatory Visit (HOSPITAL_COMMUNITY): Payer: Medicare Other | Attending: Cardiology

## 2016-06-28 DIAGNOSIS — R0602 Shortness of breath: Secondary | ICD-10-CM | POA: Diagnosis not present

## 2016-06-28 DIAGNOSIS — I359 Nonrheumatic aortic valve disorder, unspecified: Secondary | ICD-10-CM | POA: Diagnosis not present

## 2016-06-28 DIAGNOSIS — Z952 Presence of prosthetic heart valve: Secondary | ICD-10-CM

## 2016-06-28 DIAGNOSIS — Z953 Presence of xenogenic heart valve: Secondary | ICD-10-CM | POA: Insufficient documentation

## 2016-06-28 DIAGNOSIS — R079 Chest pain, unspecified: Secondary | ICD-10-CM | POA: Diagnosis not present

## 2016-07-03 ENCOUNTER — Other Ambulatory Visit: Payer: Self-pay | Admitting: *Deleted

## 2016-07-03 MED ORDER — METOPROLOL SUCCINATE ER 25 MG PO TB24: ORAL_TABLET | ORAL | 3 refills | Status: DC

## 2016-07-06 ENCOUNTER — Telehealth: Payer: Self-pay | Admitting: Cardiovascular Disease

## 2016-07-06 NOTE — Telephone Encounter (Signed)
I spoke with [name] and made her aware of echo results.

## 2016-07-06 NOTE — Telephone Encounter (Signed)
New message   [name] verbalized that is calling for the echo results

## 2016-08-01 ENCOUNTER — Other Ambulatory Visit: Payer: Self-pay | Admitting: Cardiovascular Disease

## 2016-08-01 MED ORDER — NITROGLYCERIN 0.4 MG SL SUBL: SUBLINGUAL_TABLET | SUBLINGUAL | 3 refills | Status: DC

## 2016-11-30 ENCOUNTER — Ambulatory Visit (INDEPENDENT_AMBULATORY_CARE_PROVIDER_SITE_OTHER): Payer: Medicare Other | Admitting: Family Medicine

## 2016-11-30 ENCOUNTER — Encounter: Payer: Self-pay | Admitting: Family Medicine

## 2016-11-30 VITALS — BP 105/72 | HR 76 | Temp 97.4°F | Ht 63.0 in | Wt 158.0 lb

## 2016-11-30 DIAGNOSIS — R0789 Other chest pain: Secondary | ICD-10-CM | POA: Diagnosis not present

## 2016-11-30 DIAGNOSIS — R351 Nocturia: Secondary | ICD-10-CM

## 2016-11-30 DIAGNOSIS — N401 Enlarged prostate with lower urinary tract symptoms: Secondary | ICD-10-CM | POA: Diagnosis not present

## 2016-11-30 DIAGNOSIS — I10 Essential (primary) hypertension: Secondary | ICD-10-CM | POA: Diagnosis not present

## 2016-11-30 DIAGNOSIS — R339 Retention of urine, unspecified: Secondary | ICD-10-CM

## 2016-11-30 MED ORDER — TAMSULOSIN HCL 0.4 MG PO CAPS: 0.8000 mg | ORAL_CAPSULE | Freq: Every day | ORAL | 2 refills | Status: DC
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
## 2016-12-15 ENCOUNTER — Encounter: Payer: Self-pay | Admitting: Family Medicine

## 2016-12-15 ENCOUNTER — Ambulatory Visit (INDEPENDENT_AMBULATORY_CARE_PROVIDER_SITE_OTHER): Payer: Medicare Other | Admitting: Family Medicine

## 2016-12-15 VITALS — BP 102/67 | HR 50 | Temp 96.8°F | Ht 63.0 in | Wt 163.0 lb

## 2016-12-15 DIAGNOSIS — I10 Essential (primary) hypertension: Secondary | ICD-10-CM

## 2016-12-15 DIAGNOSIS — Z125 Encounter for screening for malignant neoplasm of prostate: Secondary | ICD-10-CM

## 2016-12-15 DIAGNOSIS — R072 Precordial pain: Secondary | ICD-10-CM

## 2016-12-15 DIAGNOSIS — I359 Nonrheumatic aortic valve disorder, unspecified: Secondary | ICD-10-CM

## 2016-12-15 DIAGNOSIS — K2100 Gastro-esophageal reflux disease with esophagitis, without bleeding: Secondary | ICD-10-CM
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
## 2016-12-16 LAB — CBC WITH DIFFERENTIAL/PLATELET
BASOS ABS: 0 10*3/uL (ref 0.0–0.2)
Basos: 1 %
EOS (ABSOLUTE): 0.2 10*3/uL (ref 0.0–0.4)
EOS: 3 %
HEMATOCRIT: 40.3 % (ref 37.5–51.0)
HEMOGLOBIN: 14 g/dL (ref 13.0–17.7)
IMMATURE GRANS (ABS): 0 10*3/uL (ref 0.0–0.1)
IMMATURE GRANULOCYTES: 0 %
LYMPHS: 38 %
Lymphocytes Absolute: 2.2 10*3/uL (ref 0.7–3.1)
MCH: 30.2 pg (ref 26.6–33.0)
MCHC: 34.7 g/dL (ref 31.5–35.7)
MCV: 87 fL (ref 79–97)
MONOCYTES: 8 %
Monocytes Absolute: 0.5 10*3/uL (ref 0.1–0.9)
NEUTROS PCT: 50 %
Neutrophils Absolute: 2.8 10*3/uL (ref 1.4–7.0)
Platelets: 211 10*3/uL (ref 150–379)
RBC: 4.64 x10E6/uL (ref 4.14–5.80)
RDW: 14.1 % (ref 12.3–15.4)
WBC: 5.7 10*3/uL (ref 3.4–10.8)

## 2016-12-16 LAB — CMP14+EGFR
ALBUMIN: 4.7 g/dL (ref 3.5–5.5)
ALK PHOS: 81 IU/L (ref 39–117)
ALT: 31 IU/L (ref 0–44)
AST: 30 IU/L (ref 0–40)
Albumin/Globulin Ratio: 2.1 (ref 1.2–2.2)
BUN / CREAT RATIO: 11 (ref 9–20)
BUN: 8 mg/dL (ref 6–24)
Bilirubin Total: 1 mg/dL (ref 0.0–1.2)
CALCIUM: 9.7 mg/dL (ref 8.7–10.2)
CO2: 20 mmol/L (ref 18–29)
CREATININE: 0.71 mg/dL — AB (ref 0.76–1.27)
Chloride: 104 mmol/L (ref 96–106)
GFR calc Af Amer: 123 mL/min/{1.73_m2} (ref >59)
GFR, EST NON AFRICAN AMERICAN: 106 mL/min/{1.73_m2} (ref >59)
GLOBULIN, TOTAL: 2.2 g/dL (ref 1.5–4.5)
GLUCOSE: 96 mg/dL (ref 65–99)
Potassium: 4.5 mmol/L (ref 3.5–5.2)
SODIUM: 145 mmol/L — AB (ref 134–144)
Total Protein: 6.9 g/dL (ref 6.0–8.5)

## 2016-12-16 LAB — PSA TOTAL (REFLEX TO FREE): PROSTATE SPECIFIC AG, SERUM: 0.8 ng/mL (ref 0.0–4.0)

## 2016-12-16 LAB — LIPID PANEL
CHOLESTEROL TOTAL: 235 mg/dL — AB (ref 100–199)
Chol/HDL Ratio: 4 ratio (ref 0.0–5.0)
HDL: 59 mg/dL (ref >39)
LDL Calculated: 156 mg/dL — ABNORMAL HIGH (ref 0–99)
Triglycerides: 101 mg/dL (ref 0–149)
VLDL CHOLESTEROL CAL: 20 mg/dL (ref 5–40)

## 2016-12-31 ENCOUNTER — Other Ambulatory Visit: Payer: Self-pay | Admitting: Cardiovascular Disease

## 2017-01-02 DIAGNOSIS — H1013 Acute atopic conjunctivitis, bilateral: Secondary | ICD-10-CM | POA: Diagnosis not present

## 2017-01-09 DIAGNOSIS — H04123 Dry eye syndrome of bilateral lacrimal glands: Secondary | ICD-10-CM | POA: Diagnosis not present

## 2017-01-09 DIAGNOSIS — H40033 Anatomical narrow angle, bilateral: Secondary | ICD-10-CM | POA: Diagnosis not present

## 2017-01-29 ENCOUNTER — Ambulatory Visit (INDEPENDENT_AMBULATORY_CARE_PROVIDER_SITE_OTHER): Payer: Medicare Other | Admitting: Family Medicine

## 2017-01-29 ENCOUNTER — Encounter: Payer: Self-pay | Admitting: Family Medicine

## 2017-01-29 VITALS — BP 116/75 | HR 62 | Temp 97.7°F | Ht 63.0 in | Wt 162.0 lb

## 2017-01-29 DIAGNOSIS — R0789 Other chest pain: Secondary | ICD-10-CM

## 2017-01-29 DIAGNOSIS — F411 Generalized anxiety disorder: Secondary | ICD-10-CM | POA: Diagnosis not present

## 2017-01-29 DIAGNOSIS — I4729 Other ventricular tachycardia: Secondary | ICD-10-CM

## 2017-01-29 DIAGNOSIS — R002 Palpitations: Secondary | ICD-10-CM

## 2017-01-29 DIAGNOSIS — I359 Nonrheumatic aortic valve disorder, unspecified: Secondary | ICD-10-CM

## 2017-01-29 DIAGNOSIS — Z952 Presence of prosthetic heart valve: Secondary | ICD-10-CM

## 2017-01-29 DIAGNOSIS — I472 Ventricular tachycardia: Secondary | ICD-10-CM | POA: Diagnosis not present

## 2017-01-29 MED ORDER — DULOXETINE HCL 30 MG PO CPEP: 30.0000 mg | ORAL_CAPSULE | Freq: Every day | ORAL | 0 refills | Status: DC

## 2017-01-29 MED ORDER — MELOXICAM 15 MG PO TABS: 15.0000 mg | ORAL_TABLET | Freq: Every day | ORAL | 5 refills | Status: DC
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
## 2017-01-29 NOTE — Addendum Note (Signed)
Addended by: Claretta Fraise on: 01/29/2017 09:15 AM   Modules accepted: Orders

## 2017-01-30 ENCOUNTER — Encounter: Payer: Self-pay | Admitting: Family Medicine

## 2017-01-30 ENCOUNTER — Ambulatory Visit (INDEPENDENT_AMBULATORY_CARE_PROVIDER_SITE_OTHER): Payer: Medicare Other | Admitting: *Deleted

## 2017-01-30 DIAGNOSIS — R002 Palpitations: Secondary | ICD-10-CM | POA: Diagnosis not present

## 2017-01-30 NOTE — Progress Notes (Signed)
Holter monitor placed Asset # [id number] 

## 2017-02-26 ENCOUNTER — Other Ambulatory Visit: Payer: Self-pay | Admitting: Family Medicine

## 2017-06-08 ENCOUNTER — Other Ambulatory Visit: Payer: Self-pay | Admitting: Cardiovascular Disease

## 2017-06-11 ENCOUNTER — Encounter: Payer: Self-pay | Admitting: Cardiovascular Disease

## 2017-06-11 ENCOUNTER — Ambulatory Visit (INDEPENDENT_AMBULATORY_CARE_PROVIDER_SITE_OTHER): Payer: Medicare Other | Admitting: Cardiovascular Disease

## 2017-06-11 ENCOUNTER — Ambulatory Visit: Payer: Medicare Other | Admitting: Cardiovascular Disease

## 2017-06-11 VITALS — BP 130/80 | HR 64 | Ht 64.0 in | Wt 163.8 lb

## 2017-06-11 DIAGNOSIS — Z952 Presence of prosthetic heart valve: Secondary | ICD-10-CM

## 2017-06-11 DIAGNOSIS — I359 Nonrheumatic aortic valve disorder, unspecified: Secondary | ICD-10-CM | POA: Diagnosis not present
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
## 2017-06-11 NOTE — Patient Instructions (Signed)

## 2017-07-17 ENCOUNTER — Other Ambulatory Visit: Payer: Self-pay | Admitting: Cardiovascular Disease

## 2017-08-11 ENCOUNTER — Ambulatory Visit (INDEPENDENT_AMBULATORY_CARE_PROVIDER_SITE_OTHER): Payer: Medicare Other | Admitting: Family Medicine

## 2017-08-11 VITALS — BP 123/85 | HR 63 | Temp 97.1°F | Ht 64.0 in | Wt 166.0 lb

## 2017-08-11 DIAGNOSIS — F5101 Primary insomnia: Secondary | ICD-10-CM

## 2017-08-11 DIAGNOSIS — I10 Essential (primary) hypertension: Secondary | ICD-10-CM

## 2017-08-11 MED ORDER — TRAZODONE HCL 150 MG PO TABS: ORAL_TABLET | ORAL | 2 refills | Status: DC

## 2017-08-11 MED ORDER — LISINOPRIL 20 MG PO TABS: 20.0000 mg | ORAL_TABLET | Freq: Every day | ORAL | 1 refills | Status: DC
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
## 2017-08-28 ENCOUNTER — Ambulatory Visit (INDEPENDENT_AMBULATORY_CARE_PROVIDER_SITE_OTHER): Payer: Medicare Other | Admitting: Physician Assistant

## 2017-08-28 VITALS — BP 130/87 | HR 72 | Temp 97.2°F | Ht 64.0 in | Wt 165.6 lb

## 2017-08-28 DIAGNOSIS — K120 Recurrent oral aphthae: Secondary | ICD-10-CM | POA: Diagnosis not present

## 2017-08-28 DIAGNOSIS — H1033 Unspecified acute conjunctivitis, bilateral: Secondary | ICD-10-CM

## 2017-08-28 MED ORDER — ERYTHROMYCIN 5 MG/GM OP OINT: 1.0000 "application " | TOPICAL_OINTMENT | Freq: Four times a day (QID) | OPHTHALMIC | 0 refills | Status: DC

## 2017-08-28 NOTE — Patient Instructions (Signed)
Canker Sores Canker sores are small, painful sores that develop inside your mouth. They may also be called aphthous ulcers. You can get canker sores on the inside of your lips or cheeks, on your tongue, or anywhere inside your mouth. You can have just one canker sore or several of them. Canker sores cannot be passed from one person to another (noncontagious). These sores are different than the sores that you may get on the outside of your lips (cold sores or fever blisters). Canker sores usually start as painful red bumps. Then they turn into small white, yellow, or gray ulcers that have red borders. The ulcers may be quite painful. The pain may be worse when you eat or drink. What are the causes? The cause of this condition is not known. What increases the risk? This condition is more likely to develop in:  Women.  People in their teens or 20s.  Women who are having their menstrual period.  People who are under a lot of emotional stress.  People who do not get enough iron or B vitamins.  People who have poor oral hygiene.  People who have an injury inside the mouth. This can happen after having dental work or from chewing something hard.  What are the signs or symptoms? Along with the canker sore, symptoms may also include:  Fever.  Fatigue.  Swollen lymph nodes in your neck.  How is this diagnosed? This condition can be diagnosed based on your symptoms. Your health care provider will also examine your mouth. Your health care provider may also do tests if you get canker sores often or if they are very bad. Tests may include:  Blood tests to rule out other causes of canker sores.  Taking swabs from the sore to check for infection.  Taking a small piece of skin from the sore (biopsy) to test it for cancer.  How is this treated? Most canker sores clear up without treatment in about 10 days. Home care is usually the only treatment that you will need. Over-the-counter medicines  can relieve discomfort.If you have severe canker sores, your health care provider may prescribe:  Numbing ointment to relieve pain.  Vitamins.  Steroid medicines. These may be given as: ? Oral pills. ? Mouth rinses. ? Gels.  Antibiotic mouth rinse.  Follow these instructions at home:  Apply, take, or use medicines only as directed by your health care provider. These include vitamins.  If you were prescribed an antibiotic mouth rinse, finish all of it even if you start to feel better.  Until the sores are healed: ? Do not drink coffee or citrus juices. ? Do not eat spicy or salty foods.  Use a mild, over-the-counter mouth rinse as directed by your health care provider.  Practice good oral hygiene. ? Floss your teeth every day. ? Brush your teeth with a soft brush twice each day. Contact a health care provider if:  Your symptoms do not get better after two weeks.  You also have a fever or swollen glands.  You get canker sores often.  You have a canker sore that is getting larger.  You cannot eat or drink due to your canker sores. This information is not intended to replace advice given to you by your health care provider. Make sure you discuss any questions you have with your health care provider. Document Released: 12/23/2010 Document Revised: 02/03/2016 Document Reviewed: 07/29/2014 Elsevier Interactive Patient Education  2018 [employer]  

## 2017-08-29 ENCOUNTER — Encounter: Payer: Self-pay | Admitting: Physician Assistant
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
## 2017-09-06 ENCOUNTER — Other Ambulatory Visit: Payer: Self-pay | Admitting: *Deleted

## 2017-09-06 MED ORDER — TRAZODONE HCL 150 MG PO TABS: ORAL_TABLET | ORAL | 0 refills | Status: DC

## 2017-09-20 ENCOUNTER — Telehealth (HOSPITAL_COMMUNITY): Payer: Self-pay | Admitting: *Deleted

## 2017-09-20 NOTE — Telephone Encounter (Signed)
Attempted patient twice but connection was bad both times.

## 2017-09-24 ENCOUNTER — Other Ambulatory Visit (HOSPITAL_COMMUNITY): Payer: Medicare Other

## 2017-09-28 ENCOUNTER — Telehealth (HOSPITAL_COMMUNITY): Payer: Self-pay | Admitting: *Deleted

## 2017-09-28 NOTE — Telephone Encounter (Signed)
Left message on voicemail in reference to upcoming appointment scheduled for 08/12/18. Phone number given for a call back so details instructions can be given. Sharon S Brooks ° ° °

## 2017-10-01 ENCOUNTER — Telehealth (HOSPITAL_COMMUNITY): Payer: Self-pay | Admitting: *Deleted

## 2017-10-01 NOTE — Telephone Encounter (Signed)
Patient given detailed instructions per Stress Test Requisition Sheet for test on 10/02/17 at 7:30.Patient Notified to arrive 30 minutes early, and that it is imperative to arrive on time for appointment to keep from having the test rescheduled.  Patient verbalized understanding. Veronia Beets

## 2017-10-02 ENCOUNTER — Ambulatory Visit (HOSPITAL_COMMUNITY): Payer: Medicare Other

## 2017-10-02 ENCOUNTER — Ambulatory Visit (HOSPITAL_COMMUNITY): Payer: Medicare Other | Attending: Cardiology

## 2017-10-02 DIAGNOSIS — I2510 Atherosclerotic heart disease of native coronary artery without angina pectoris: Secondary | ICD-10-CM | POA: Diagnosis not present

## 2017-10-02 DIAGNOSIS — Z952 Presence of prosthetic heart valve: Secondary | ICD-10-CM | POA: Insufficient documentation

## 2017-10-02 DIAGNOSIS — R072 Precordial pain: Secondary | ICD-10-CM

## 2017-10-02 DIAGNOSIS — I10 Essential (primary) hypertension: Secondary | ICD-10-CM | POA: Insufficient documentation

## 2017-10-02 DIAGNOSIS — R079 Chest pain, unspecified: Secondary | ICD-10-CM | POA: Diagnosis present

## 2017-10-02 DIAGNOSIS — I472 Ventricular tachycardia: Secondary | ICD-10-CM | POA: Diagnosis not present

## 2017-11-06 ENCOUNTER — Other Ambulatory Visit: Payer: Self-pay | Admitting: Cardiovascular Disease

## 2017-11-06 MED ORDER — NITROGLYCERIN 0.4 MG SL SUBL: SUBLINGUAL_TABLET | SUBLINGUAL | 1 refills | Status: DC

## 2017-11-22 ENCOUNTER — Other Ambulatory Visit: Payer: Self-pay

## 2017-11-22 NOTE — Telephone Encounter (Signed)
Medication Detail    Disp Refills Start End   nitroGLYCERIN (NITROSTAT) 0.4 MG SL tablet 75 tablet 1 11/06/2017    Sig: PLACE 1 TAB UNDER THE TONGUE AS NEEDED FOR CHEST PAIN, MAX 3 DOSES EVERY 5 MINUTES   Sent to pharmacy as: nitroGLYCERIN (NITROSTAT) 0.4 MG SL tablet   E-Prescribing Status: Receipt confirmed by pharmacy (11/06/2017 8:08 AM EST)   Pharmacy   [id number] - [place], [place] Encounter  Priority and Order Details

## 2017-12-10 ENCOUNTER — Ambulatory Visit (INDEPENDENT_AMBULATORY_CARE_PROVIDER_SITE_OTHER): Payer: Medicare Other | Admitting: Family Medicine

## 2017-12-10 ENCOUNTER — Encounter: Payer: Self-pay | Admitting: Family Medicine

## 2017-12-10 VITALS — BP 121/77 | HR 57 | Temp 96.7°F | Ht 64.0 in | Wt 164.0 lb

## 2017-12-10 DIAGNOSIS — Z952 Presence of prosthetic heart valve: Secondary | ICD-10-CM

## 2017-12-10 DIAGNOSIS — I10 Essential (primary) hypertension: Secondary | ICD-10-CM

## 2017-12-10 DIAGNOSIS — K2100 Gastro-esophageal reflux disease with esophagitis, without bleeding: Secondary | ICD-10-CM

## 2017-12-10 DIAGNOSIS — K120 Recurrent oral aphthae: Secondary | ICD-10-CM

## 2017-12-10 DIAGNOSIS — F411 Generalized anxiety disorder: Secondary | ICD-10-CM

## 2017-12-10 DIAGNOSIS — F5101 Primary insomnia: Secondary | ICD-10-CM

## 2017-12-10 DIAGNOSIS — I509 Heart failure, unspecified: Secondary | ICD-10-CM

## 2017-12-10 LAB — CMP14+EGFR
ALBUMIN: 4.8 g/dL (ref 3.5–5.5)
ALK PHOS: 85 IU/L (ref 39–117)
ALT: 20 IU/L (ref 0–44)
AST: 23 IU/L (ref 0–40)
Albumin/Globulin Ratio: 2.3 — ABNORMAL HIGH (ref 1.2–2.2)
BILIRUBIN TOTAL: 1.2 mg/dL (ref 0.0–1.2)
BUN / CREAT RATIO: 16 (ref 9–20)
BUN: 13 mg/dL (ref 6–24)
CHLORIDE: 102 mmol/L (ref 96–106)
CO2: 27 mmol/L (ref 20–29)
Calcium: 9.9 mg/dL (ref 8.7–10.2)
Creatinine, Ser: 0.81 mg/dL (ref 0.76–1.27)
GFR calc Af Amer: 116 mL/min/{1.73_m2} (ref >59)
GFR calc non Af Amer: 100 mL/min/{1.73_m2} (ref >59)
GLUCOSE: 97 mg/dL (ref 65–99)
Globulin, Total: 2.1 g/dL (ref 1.5–4.5)
Potassium: 4.4 mmol/L (ref 3.5–5.2)
SODIUM: 139 mmol/L (ref 134–144)
Total Protein: 6.9 g/dL (ref 6.0–8.5)

## 2017-12-10 MED ORDER — METOPROLOL SUCCINATE ER 100 MG PO TB24: 100.0000 mg | ORAL_TABLET | ORAL | 1 refills | Status: DC

## 2017-12-10 MED ORDER — ACYCLOVIR 200 MG PO CAPS: 400.0000 mg | ORAL_CAPSULE | Freq: Two times a day (BID) | ORAL | 10 refills | Status: DC

## 2017-12-10 MED ORDER — ZOLPIDEM TARTRATE 5 MG PO TABS: 5.0000 mg | ORAL_TABLET | Freq: Every evening | ORAL | 2 refills | Status: DC | PRN
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
## 2017-12-10 NOTE — Patient Instructions (Signed)
DASH Eating Plan DASH stands for "Dietary Approaches to Stop Hypertension." The DASH eating plan is a healthy eating plan that has been shown to reduce high blood pressure (hypertension). It may also reduce your risk for type 2 diabetes, heart disease, and stroke. The DASH eating plan may also help with weight loss. What are tips for following this plan? General guidelines  Avoid eating more than 2,300 mg (milligrams) of salt (sodium) a day. If you have hypertension, you may need to reduce your sodium intake to 1,500 mg a day.  Limit alcohol intake to no more than 1 drink a day for nonpregnant women and 2 drinks a day for men. One drink equals 12 oz of beer, 5 oz of wine, or 1 oz of hard liquor.  Work with your health care provider to maintain a healthy body weight or to lose weight. Ask what an ideal weight is for you.  Get at least 30 minutes of exercise that causes your heart to beat faster (aerobic exercise) most days of the week. Activities may include walking, swimming, or biking.  Work with your health care provider or diet and nutrition specialist (dietitian) to adjust your eating plan to your individual calorie needs. Reading food labels  Check food labels for the amount of sodium per serving. Choose foods with less than 5 percent of the Daily Value of sodium. Generally, foods with less than 300 mg of sodium per serving fit into this eating plan.  To find whole grains, look for the word "whole" as the first word in the ingredient list. Shopping  Buy products labeled as "low-sodium" or "no salt added."  Buy fresh foods. Avoid canned foods and premade or frozen meals. Cooking  Avoid adding salt when cooking. Use salt-free seasonings or herbs instead of table salt or sea salt. Check with your health care provider or pharmacist before using salt substitutes.  Do not fry foods. Cook foods using healthy methods such as baking, boiling, grilling, and broiling instead.  Cook with  heart-healthy oils, such as olive, canola, soybean, or sunflower oil. Meal planning   Eat a balanced diet that includes: ? 5 or more servings of fruits and vegetables each day. At each meal, try to fill half of your plate with fruits and vegetables. ? Up to 6-8 servings of whole grains each day. ? Less than 6 oz of lean meat, poultry, or fish each day. A 3-oz serving of meat is about the same size as a deck of cards. One egg equals 1 oz. ? 2 servings of low-fat dairy each day. ? A serving of nuts, seeds, or beans 5 times each week. ? Heart-healthy fats. Healthy fats called Omega-3 fatty acids are found in foods such as flaxseeds and coldwater fish, like sardines, salmon, and mackerel.  Limit how much you eat of the following: ? Canned or prepackaged foods. ? Food that is high in trans fat, such as fried foods. ? Food that is high in saturated fat, such as fatty meat. ? Sweets, desserts, sugary drinks, and other foods with added sugar. ? Full-fat dairy products.  Do not salt foods before eating.  Try to eat at least 2 vegetarian meals each week.  Eat more home-cooked food and less restaurant, buffet, and fast food.  When eating at a restaurant, ask that your food be prepared with less salt or no salt, if possible. What foods are recommended? The items listed may not be a complete list. Talk with your dietitian about what   dietary choices are best for you. Grains Whole-grain or whole-wheat bread. Whole-grain or whole-wheat pasta. Brown rice. Oatmeal. Quinoa. Bulgur. Whole-grain and low-sodium cereals. Pita bread. Low-fat, low-sodium crackers. Whole-wheat flour tortillas. Vegetables Fresh or frozen vegetables (raw, steamed, roasted, or grilled). Low-sodium or reduced-sodium tomato and vegetable juice. Low-sodium or reduced-sodium tomato sauce and tomato paste. Low-sodium or reduced-sodium canned vegetables. Fruits All fresh, dried, or frozen fruit. Canned fruit in natural juice (without  added sugar). Meat and other protein foods Skinless chicken or turkey. Ground chicken or turkey. Pork with fat trimmed off. Fish and seafood. Egg whites. Dried beans, peas, or lentils. Unsalted nuts, nut butters, and seeds. Unsalted canned beans. Lean cuts of beef with fat trimmed off. Low-sodium, lean deli meat. Dairy Low-fat (1%) or fat-free (skim) milk. Fat-free, low-fat, or reduced-fat cheeses. Nonfat, low-sodium ricotta or cottage cheese. Low-fat or nonfat yogurt. Low-fat, low-sodium cheese. Fats and oils Soft margarine without trans fats. Vegetable oil. Low-fat, reduced-fat, or light mayonnaise and salad dressings (reduced-sodium). Canola, safflower, olive, soybean, and sunflower oils. Avocado. Seasoning and other foods Herbs. Spices. Seasoning mixes without salt. Unsalted popcorn and pretzels. Fat-free sweets. What foods are not recommended? The items listed may not be a complete list. Talk with your dietitian about what dietary choices are best for you. Grains Baked goods made with fat, such as croissants, muffins, or some breads. Dry pasta or rice meal packs. Vegetables Creamed or fried vegetables. Vegetables in a cheese sauce. Regular canned vegetables (not low-sodium or reduced-sodium). Regular canned tomato sauce and paste (not low-sodium or reduced-sodium). Regular tomato and vegetable juice (not low-sodium or reduced-sodium). Pickles. Olives. Fruits Canned fruit in a light or heavy syrup. Fried fruit. Fruit in cream or butter sauce. Meat and other protein foods Fatty cuts of meat. Ribs. Fried meat. Bacon. Sausage. Bologna and other processed lunch meats. Salami. Fatback. Hotdogs. Bratwurst. Salted nuts and seeds. Canned beans with added salt. Canned or smoked fish. Whole eggs or egg yolks. Chicken or turkey with skin. Dairy Whole or 2% milk, cream, and half-and-half. Whole or full-fat cream cheese. Whole-fat or sweetened yogurt. Full-fat cheese. Nondairy creamers. Whipped toppings.  Processed cheese and cheese spreads. Fats and oils Butter. Stick margarine. Lard. Shortening. Ghee. Bacon fat. Tropical oils, such as coconut, palm kernel, or palm oil. Seasoning and other foods Salted popcorn and pretzels. Onion salt, garlic salt, seasoned salt, table salt, and sea salt. Worcestershire sauce. Tartar sauce. Barbecue sauce. Teriyaki sauce. Soy sauce, including reduced-sodium. Steak sauce. Canned and packaged gravies. Fish sauce. Oyster sauce. Cocktail sauce. Horseradish that you find on the shelf. Ketchup. Mustard. Meat flavorings and tenderizers. Bouillon cubes. Hot sauce and Tabasco sauce. Premade or packaged marinades. Premade or packaged taco seasonings. Relishes. Regular salad dressings. Where to find more information:  National Heart, Lung, and Blood Institute: www.nhlbi.nih.gov  American Heart Association: www.heart.org Summary  The DASH eating plan is a healthy eating plan that has been shown to reduce high blood pressure (hypertension). It may also reduce your risk for type 2 diabetes, heart disease, and stroke.  With the DASH eating plan, you should limit salt (sodium) intake to 2,300 mg a day. If you have hypertension, you may need to reduce your sodium intake to 1,500 mg a day.  When on the DASH eating plan, aim to eat more fresh fruits and vegetables, whole grains, lean proteins, low-fat dairy, and heart-healthy fats.  Work with your health care provider or diet and nutrition specialist (dietitian) to adjust your eating plan to your individual   calorie needs. This information is not intended to replace advice given to you by your health care provider. Make sure you discuss any questions you have with your health care provider. Document Released: 08/17/2011 Document Revised: 08/21/2016 Document Reviewed: 08/21/2016 Elsevier Interactive Patient Education  2018 Else[employer] Inc.  

## 2017-12-18 ENCOUNTER — Telehealth: Payer: Self-pay | Admitting: Family Medicine

## 2017-12-18 DIAGNOSIS — Z1211 Encounter for screening for malignant neoplasm of colon: Secondary | ICD-10-CM

## 2017-12-18 NOTE — Telephone Encounter (Signed)
Please address

## 2017-12-18 NOTE — Telephone Encounter (Signed)
Please refer as pt requests 

## 2017-12-19 NOTE — Telephone Encounter (Signed)
Referral placed.

## 2018-01-12 DIAGNOSIS — H5213 Myopia, bilateral: Secondary | ICD-10-CM | POA: Diagnosis not present

## 2018-01-13 ENCOUNTER — Other Ambulatory Visit: Payer: Self-pay | Admitting: Family Medicine

## 2018-01-21 ENCOUNTER — Telehealth: Payer: Self-pay | Admitting: Family Medicine

## 2018-02-14 ENCOUNTER — Other Ambulatory Visit: Payer: Self-pay | Admitting: Cardiovascular Disease

## 2018-03-02 ENCOUNTER — Other Ambulatory Visit: Payer: Self-pay | Admitting: Cardiovascular Disease

## 2018-03-12 ENCOUNTER — Ambulatory Visit (INDEPENDENT_AMBULATORY_CARE_PROVIDER_SITE_OTHER): Payer: Medicare Other | Admitting: Family Medicine

## 2018-03-12 ENCOUNTER — Encounter: Payer: Self-pay | Admitting: Family Medicine

## 2018-03-12 VITALS — BP 106/63 | HR 66 | Temp 97.1°F | Ht 64.0 in | Wt 165.4 lb

## 2018-03-12 DIAGNOSIS — F5101 Primary insomnia: Secondary | ICD-10-CM

## 2018-03-12 DIAGNOSIS — I509 Heart failure, unspecified: Secondary | ICD-10-CM | POA: Diagnosis not present

## 2018-03-12 DIAGNOSIS — R208 Other disturbances of skin sensation: Secondary | ICD-10-CM

## 2018-03-12 DIAGNOSIS — I10 Essential (primary) hypertension: Secondary | ICD-10-CM | POA: Diagnosis not present

## 2018-03-12 MED ORDER — PREDNISONE 10 MG PO TABS: ORAL_TABLET | ORAL | 0 refills | Status: DC

## 2018-03-12 MED ORDER — ZOLPIDEM TARTRATE 5 MG PO TABS: 5.0000 mg | ORAL_TABLET | Freq: Every evening | ORAL | 5 refills | Status: DC | PRN
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
## 2018-03-12 NOTE — Patient Instructions (Signed)
DASH Eating Plan DASH stands for "Dietary Approaches to Stop Hypertension." The DASH eating plan is a healthy eating plan that has been shown to reduce high blood pressure (hypertension). It may also reduce your risk for type 2 diabetes, heart disease, and stroke. The DASH eating plan may also help with weight loss. What are tips for following this plan? General guidelines  Avoid eating more than 2,300 mg (milligrams) of salt (sodium) a day. If you have hypertension, you may need to reduce your sodium intake to 1,500 mg a day.  Limit alcohol intake to no more than 1 drink a day for nonpregnant women and 2 drinks a day for men. One drink equals 12 oz of beer, 5 oz of wine, or 1 oz of hard liquor.  Work with your health care provider to maintain a healthy body weight or to lose weight. Ask what an ideal weight is for you.  Get at least 30 minutes of exercise that causes your heart to beat faster (aerobic exercise) most days of the week. Activities may include walking, swimming, or biking.  Work with your health care provider or diet and nutrition specialist (dietitian) to adjust your eating plan to your individual calorie needs. Reading food labels  Check food labels for the amount of sodium per serving. Choose foods with less than 5 percent of the Daily Value of sodium. Generally, foods with less than 300 mg of sodium per serving fit into this eating plan.  To find whole grains, look for the word "whole" as the first word in the ingredient list. Shopping  Buy products labeled as "low-sodium" or "no salt added."  Buy fresh foods. Avoid canned foods and premade or frozen meals. Cooking  Avoid adding salt when cooking. Use salt-free seasonings or herbs instead of table salt or sea salt. Check with your health care provider or pharmacist before using salt substitutes.  Do not fry foods. Cook foods using healthy methods such as baking, boiling, grilling, and broiling instead.  Cook with  heart-healthy oils, such as olive, canola, soybean, or sunflower oil. Meal planning   Eat a balanced diet that includes: ? 5 or more servings of fruits and vegetables each day. At each meal, try to fill half of your plate with fruits and vegetables. ? Up to 6-8 servings of whole grains each day. ? Less than 6 oz of lean meat, poultry, or fish each day. A 3-oz serving of meat is about the same size as a deck of cards. One egg equals 1 oz. ? 2 servings of low-fat dairy each day. ? A serving of nuts, seeds, or beans 5 times each week. ? Heart-healthy fats. Healthy fats called Omega-3 fatty acids are found in foods such as flaxseeds and coldwater fish, like sardines, salmon, and mackerel.  Limit how much you eat of the following: ? Canned or prepackaged foods. ? Food that is high in trans fat, such as fried foods. ? Food that is high in saturated fat, such as fatty meat. ? Sweets, desserts, sugary drinks, and other foods with added sugar. ? Full-fat dairy products.  Do not salt foods before eating.  Try to eat at least 2 vegetarian meals each week.  Eat more home-cooked food and less restaurant, buffet, and fast food.  When eating at a restaurant, ask that your food be prepared with less salt or no salt, if possible. What foods are recommended? The items listed may not be a complete list. Talk with your dietitian about what   dietary choices are best for you. Grains Whole-grain or whole-wheat bread. Whole-grain or whole-wheat pasta. Brown rice. Oatmeal. Quinoa. Bulgur. Whole-grain and low-sodium cereals. Pita bread. Low-fat, low-sodium crackers. Whole-wheat flour tortillas. Vegetables Fresh or frozen vegetables (raw, steamed, roasted, or grilled). Low-sodium or reduced-sodium tomato and vegetable juice. Low-sodium or reduced-sodium tomato sauce and tomato paste. Low-sodium or reduced-sodium canned vegetables. Fruits All fresh, dried, or frozen fruit. Canned fruit in natural juice (without  added sugar). Meat and other protein foods Skinless chicken or turkey. Ground chicken or turkey. Pork with fat trimmed off. Fish and seafood. Egg whites. Dried beans, peas, or lentils. Unsalted nuts, nut butters, and seeds. Unsalted canned beans. Lean cuts of beef with fat trimmed off. Low-sodium, lean deli meat. Dairy Low-fat (1%) or fat-free (skim) milk. Fat-free, low-fat, or reduced-fat cheeses. Nonfat, low-sodium ricotta or cottage cheese. Low-fat or nonfat yogurt. Low-fat, low-sodium cheese. Fats and oils Soft margarine without trans fats. Vegetable oil. Low-fat, reduced-fat, or light mayonnaise and salad dressings (reduced-sodium). Canola, safflower, olive, soybean, and sunflower oils. Avocado. Seasoning and other foods Herbs. Spices. Seasoning mixes without salt. Unsalted popcorn and pretzels. Fat-free sweets. What foods are not recommended? The items listed may not be a complete list. Talk with your dietitian about what dietary choices are best for you. Grains Baked goods made with fat, such as croissants, muffins, or some breads. Dry pasta or rice meal packs. Vegetables Creamed or fried vegetables. Vegetables in a cheese sauce. Regular canned vegetables (not low-sodium or reduced-sodium). Regular canned tomato sauce and paste (not low-sodium or reduced-sodium). Regular tomato and vegetable juice (not low-sodium or reduced-sodium). Pickles. Olives. Fruits Canned fruit in a light or heavy syrup. Fried fruit. Fruit in cream or butter sauce. Meat and other protein foods Fatty cuts of meat. Ribs. Fried meat. Bacon. Sausage. Bologna and other processed lunch meats. Salami. Fatback. Hotdogs. Bratwurst. Salted nuts and seeds. Canned beans with added salt. Canned or smoked fish. Whole eggs or egg yolks. Chicken or turkey with skin. Dairy Whole or 2% milk, cream, and half-and-half. Whole or full-fat cream cheese. Whole-fat or sweetened yogurt. Full-fat cheese. Nondairy creamers. Whipped toppings.  Processed cheese and cheese spreads. Fats and oils Butter. Stick margarine. Lard. Shortening. Ghee. Bacon fat. Tropical oils, such as coconut, palm kernel, or palm oil. Seasoning and other foods Salted popcorn and pretzels. Onion salt, garlic salt, seasoned salt, table salt, and sea salt. Worcestershire sauce. Tartar sauce. Barbecue sauce. Teriyaki sauce. Soy sauce, including reduced-sodium. Steak sauce. Canned and packaged gravies. Fish sauce. Oyster sauce. Cocktail sauce. Horseradish that you find on the shelf. Ketchup. Mustard. Meat flavorings and tenderizers. Bouillon cubes. Hot sauce and Tabasco sauce. Premade or packaged marinades. Premade or packaged taco seasonings. Relishes. Regular salad dressings. Where to find more information:  National Heart, Lung, and Blood Institute: www.nhlbi.nih.gov  American Heart Association: www.heart.org Summary  The DASH eating plan is a healthy eating plan that has been shown to reduce high blood pressure (hypertension). It may also reduce your risk for type 2 diabetes, heart disease, and stroke.  With the DASH eating plan, you should limit salt (sodium) intake to 2,300 mg a day. If you have hypertension, you may need to reduce your sodium intake to 1,500 mg a day.  When on the DASH eating plan, aim to eat more fresh fruits and vegetables, whole grains, lean proteins, low-fat dairy, and heart-healthy fats.  Work with your health care provider or diet and nutrition specialist (dietitian) to adjust your eating plan to your individual   calorie needs. This information is not intended to replace advice given to you by your health care provider. Make sure you discuss any questions you have with your health care provider. Document Released: 08/17/2011 Document Revised: 08/21/2016 Document Reviewed: 08/21/2016 Elsevier Interactive Patient Education  2018 Else[employer] Inc.  

## 2018-03-13 LAB — CMP14+EGFR
A/G RATIO: 1.9 (ref 1.2–2.2)
ALT: 29 IU/L (ref 0–44)
AST: 26 IU/L (ref 0–40)
Albumin: 4.5 g/dL (ref 3.5–5.5)
Alkaline Phosphatase: 80 IU/L (ref 39–117)
BUN/Creatinine Ratio: 14 (ref 9–20)
BUN: 13 mg/dL (ref 6–24)
Bilirubin Total: 1.1 mg/dL (ref 0.0–1.2)
CALCIUM: 9.8 mg/dL (ref 8.7–10.2)
CO2: 25 mmol/L (ref 20–29)
CREATININE: 0.94 mg/dL (ref 0.76–1.27)
Chloride: 100 mmol/L (ref 96–106)
GFR, EST AFRICAN AMERICAN: 104 mL/min/{1.73_m2} (ref >59)
GFR, EST NON AFRICAN AMERICAN: 90 mL/min/{1.73_m2} (ref >59)
Globulin, Total: 2.4 g/dL (ref 1.5–4.5)
Glucose: 83 mg/dL (ref 65–99)
Potassium: 4.6 mmol/L (ref 3.5–5.2)
Sodium: 138 mmol/L (ref 134–144)
TOTAL PROTEIN: 6.9 g/dL (ref 6.0–8.5)

## 2018-03-13 LAB — CBC WITH DIFFERENTIAL/PLATELET
Basophils Absolute: 0 10*3/uL (ref 0.0–0.2)
Basos: 0 %
EOS (ABSOLUTE): 0.3 10*3/uL (ref 0.0–0.4)
EOS: 6 %
Hematocrit: 42.9 % (ref 37.5–51.0)
Hemoglobin: 14.6 g/dL (ref 13.0–17.7)
IMMATURE GRANS (ABS): 0 10*3/uL (ref 0.0–0.1)
IMMATURE GRANULOCYTES: 0 %
LYMPHS: 42 %
Lymphocytes Absolute: 2 10*3/uL (ref 0.7–3.1)
MCH: 30.9 pg (ref 26.6–33.0)
MCHC: 34 g/dL (ref 31.5–35.7)
MCV: 91 fL (ref 79–97)
MONOS ABS: 0.3 10*3/uL (ref 0.1–0.9)
Monocytes: 7 %
NEUTROS PCT: 45 %
Neutrophils Absolute: 2.1 10*3/uL (ref 1.4–7.0)
PLATELETS: 225 10*3/uL (ref 150–450)
RBC: 4.73 x10E6/uL (ref 4.14–5.80)
RDW: 13.9 % (ref 12.3–15.4)
WBC: 4.7 10*3/uL (ref 3.4–10.8)

## 2018-03-13 LAB — SEDIMENTATION RATE: SED RATE: 4 mm/h (ref 0–30)

## 2018-03-13 LAB — BRAIN NATRIURETIC PEPTIDE: BNP: 5.5 pg/mL (ref 0.0–100.0)

## 2018-04-18 ENCOUNTER — Ambulatory Visit (INDEPENDENT_AMBULATORY_CARE_PROVIDER_SITE_OTHER): Payer: Medicare Other

## 2018-04-18 VITALS — BP 140/93 | HR 67 | Temp 97.3°F | Ht 64.0 in | Wt 167.0 lb

## 2018-04-18 DIAGNOSIS — Z0000 Encounter for general adult medical examination without abnormal findings: Secondary | ICD-10-CM

## 2018-04-18 MED ORDER — OMEPRAZOLE 40 MG PO CPDR: 40.0000 mg | DELAYED_RELEASE_CAPSULE | Freq: Every day | ORAL | 1 refills | Status: DC | PRN

## 2018-04-18 NOTE — Patient Instructions (Signed)
  [name] , Thank you for taking time to come for your Medicare Wellness Visit. I appreciate your ongoing commitment to your health goals. Please review the following plan we discussed and let me know if I can assist you in the future.   These are the goals we discussed: Goals    . Blood Pressure < 140/90    . Have 3 meals a day       This is a list of the screening recommended for you and due dates:  Health Maintenance  Topic Date Due  .  Hepatitis C: One time screening is recommended by Center for Disease Control  (CDC) for  adults born from [birth year] through 1964.   November 06, 1960  . HIV Screening  01/20/1977  . Flu Shot  09/11/2018*  . Tetanus Vaccine  11/06/2019  . Colon Cancer Screening  10/16/2024  *Topic was postponed. The date shown is not the original due date.
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
## 2018-05-13 ENCOUNTER — Other Ambulatory Visit: Payer: Self-pay | Admitting: Family Medicine

## 2018-05-15 DIAGNOSIS — Z86010 Personal history of colonic polyps: Secondary | ICD-10-CM | POA: Diagnosis not present

## 2018-06-06 ENCOUNTER — Other Ambulatory Visit: Payer: Self-pay | Admitting: Family Medicine

## 2018-06-06 DIAGNOSIS — I10 Essential (primary) hypertension: Secondary | ICD-10-CM

## 2018-06-25 ENCOUNTER — Encounter: Payer: Self-pay | Admitting: *Deleted

## 2018-07-12 ENCOUNTER — Ambulatory Visit (INDEPENDENT_AMBULATORY_CARE_PROVIDER_SITE_OTHER): Payer: Medicare Other | Admitting: Cardiovascular Disease

## 2018-07-12 ENCOUNTER — Encounter: Payer: Self-pay | Admitting: Cardiovascular Disease

## 2018-07-12 VITALS — BP 150/86 | HR 74 | Ht 64.0 in | Wt 166.1 lb

## 2018-07-12 DIAGNOSIS — I359 Nonrheumatic aortic valve disorder, unspecified: Secondary | ICD-10-CM | POA: Diagnosis not present

## 2018-07-12 NOTE — Patient Instructions (Addendum)
Medication Instructions:  Your provider recommends that you continue on your current medications as directed. Please refer to the Current Medication list given to you today.    Labwork: None  Testing/Procedures: Your provider has requested that you have an echocardiogram. Echocardiography is a painless test that uses sound waves to create images of your heart. It provides your doctor with information about the size and shape of your heart and how well your heart's chambers and valves are working. This procedure takes approximately one hour. There are no restrictions for this procedure. Your echocardiogram is scheduled 07/17/2018. Please arrive by 8:00AM.      Follow-Up: Your provider wants you to follow-up in: 1 year with Dr. Burt Knack or his assistant. You will receive a reminder letter in the mail two months in advance. If you don't receive a letter, please call our office to schedule the follow-up appointment.    Any Other Special Instructions Will Be Listed Below (If Applicable).     If you need a refill on your cardiac medications before your next appointment, please call your pharmacy.
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
## 2018-07-17 ENCOUNTER — Ambulatory Visit (HOSPITAL_COMMUNITY): Payer: Medicare Other | Attending: Cardiology

## 2018-07-17 ENCOUNTER — Other Ambulatory Visit: Payer: Self-pay

## 2018-07-17 DIAGNOSIS — I359 Nonrheumatic aortic valve disorder, unspecified: Secondary | ICD-10-CM | POA: Diagnosis not present

## 2018-07-18 ENCOUNTER — Other Ambulatory Visit: Payer: Self-pay | Admitting: Cardiovascular Disease

## 2018-08-12 ENCOUNTER — Ambulatory Visit: Payer: Medicare Other | Admitting: Cardiovascular Disease

## 2018-09-13 ENCOUNTER — Ambulatory Visit: Payer: Medicare Other | Admitting: Family Medicine

## 2018-09-16 ENCOUNTER — Ambulatory Visit: Payer: Medicare Other | Admitting: Family Medicine

## 2018-09-25 ENCOUNTER — Ambulatory Visit (INDEPENDENT_AMBULATORY_CARE_PROVIDER_SITE_OTHER): Payer: Medicare Other | Admitting: Family Medicine

## 2018-09-25 ENCOUNTER — Ambulatory Visit (INDEPENDENT_AMBULATORY_CARE_PROVIDER_SITE_OTHER): Payer: Medicare Other

## 2018-09-25 VITALS — BP 129/86 | HR 75 | Temp 97.3°F | Ht 64.0 in | Wt 166.1 lb

## 2018-09-25 DIAGNOSIS — E039 Hypothyroidism, unspecified: Secondary | ICD-10-CM

## 2018-09-25 DIAGNOSIS — R61 Generalized hyperhidrosis: Secondary | ICD-10-CM

## 2018-09-25 DIAGNOSIS — N401 Enlarged prostate with lower urinary tract symptoms: Secondary | ICD-10-CM | POA: Diagnosis not present

## 2018-09-25 DIAGNOSIS — E7800 Pure hypercholesterolemia, unspecified: Secondary | ICD-10-CM

## 2018-09-25 DIAGNOSIS — M542 Cervicalgia: Secondary | ICD-10-CM

## 2018-09-25 DIAGNOSIS — R351 Nocturia: Secondary | ICD-10-CM

## 2018-09-25 DIAGNOSIS — I10 Essential (primary) hypertension: Secondary | ICD-10-CM

## 2018-09-25 DIAGNOSIS — M4802 Spinal stenosis, cervical region: Secondary | ICD-10-CM | POA: Diagnosis not present

## 2018-09-25 MED ORDER — METOPROLOL SUCCINATE ER 100 MG PO TB24: 100.0000 mg | ORAL_TABLET | ORAL | 1 refills | Status: DC

## 2018-09-25 MED ORDER — LISINOPRIL 20 MG PO TABS: 20.0000 mg | ORAL_TABLET | Freq: Every day | ORAL | 1 refills | Status: DC

## 2018-09-25 MED ORDER — OMEPRAZOLE 40 MG PO CPDR: 40.0000 mg | DELAYED_RELEASE_CAPSULE | Freq: Every day | ORAL | 1 refills | Status: DC | PRN

## 2018-09-26 ENCOUNTER — Other Ambulatory Visit: Payer: Medicare Other

## 2018-09-26 DIAGNOSIS — I10 Essential (primary) hypertension: Secondary | ICD-10-CM | POA: Diagnosis not present

## 2018-09-26 DIAGNOSIS — E7800 Pure hypercholesterolemia, unspecified: Secondary | ICD-10-CM | POA: Diagnosis not present

## 2018-09-26 DIAGNOSIS — R61 Generalized hyperhidrosis: Secondary | ICD-10-CM | POA: Diagnosis not present

## 2018-09-28 LAB — TESTOSTERONE,FREE AND TOTAL
Testosterone, Free: 12.5 pg/mL (ref 7.2–24.0)
Testosterone: 450 ng/dL (ref 264–916)

## 2018-09-28 LAB — CBC WITH DIFFERENTIAL/PLATELET
Basophils Absolute: 0 10*3/uL (ref 0.0–0.2)
Basos: 1 %
EOS (ABSOLUTE): 0.3 10*3/uL (ref 0.0–0.4)
Eos: 4 %
HEMATOCRIT: 43.5 % (ref 37.5–51.0)
Hemoglobin: 15.1 g/dL (ref 13.0–17.7)
Immature Grans (Abs): 0 10*3/uL (ref 0.0–0.1)
Immature Granulocytes: 0 %
Lymphocytes Absolute: 2.3 10*3/uL (ref 0.7–3.1)
Lymphs: 39 %
MCH: 31 pg (ref 26.6–33.0)
MCHC: 34.7 g/dL (ref 31.5–35.7)
MCV: 89 fL (ref 79–97)
Monocytes Absolute: 0.7 10*3/uL (ref 0.1–0.9)
Monocytes: 12 %
NEUTROS PCT: 44 %
Neutrophils Absolute: 2.6 10*3/uL (ref 1.4–7.0)
Platelets: 244 10*3/uL (ref 150–450)
RBC: 4.87 x10E6/uL (ref 4.14–5.80)
RDW: 13.5 % (ref 11.6–15.4)
WBC: 6 10*3/uL (ref 3.4–10.8)

## 2018-09-28 LAB — PSA, TOTAL AND FREE
PSA, Free Pct: 25.7 %
PSA, Free: 0.18 ng/mL
Prostate Specific Ag, Serum: 0.7 ng/mL (ref 0.0–4.0)

## 2018-09-28 LAB — CMP14+EGFR
A/G RATIO: 2 (ref 1.2–2.2)
ALT: 26 IU/L (ref 0–44)
AST: 28 IU/L (ref 0–40)
Albumin: 4.9 g/dL (ref 3.5–5.5)
Alkaline Phosphatase: 98 IU/L (ref 39–117)
BUN/Creatinine Ratio: 14 (ref 9–20)
BUN: 11 mg/dL (ref 6–24)
Bilirubin Total: 1.2 mg/dL (ref 0.0–1.2)
CO2: 21 mmol/L (ref 20–29)
Calcium: 9.8 mg/dL (ref 8.7–10.2)
Chloride: 100 mmol/L (ref 96–106)
Creatinine, Ser: 0.79 mg/dL (ref 0.76–1.27)
GFR calc Af Amer: 116 mL/min/{1.73_m2} (ref >59)
GFR calc non Af Amer: 100 mL/min/{1.73_m2} (ref >59)
Globulin, Total: 2.4 g/dL (ref 1.5–4.5)
Glucose: 98 mg/dL (ref 65–99)
Potassium: 4.6 mmol/L (ref 3.5–5.2)
SODIUM: 138 mmol/L (ref 134–144)
Total Protein: 7.3 g/dL (ref 6.0–8.5)

## 2018-09-28 LAB — LIPID PANEL
Chol/HDL Ratio: 5.2 ratio — ABNORMAL HIGH (ref 0.0–5.0)
Cholesterol, Total: 229 mg/dL — ABNORMAL HIGH (ref 100–199)
HDL: 44 mg/dL (ref >39)
LDL Calculated: 154 mg/dL — ABNORMAL HIGH (ref 0–99)
TRIGLYCERIDES: 156 mg/dL — AB (ref 0–149)
VLDL Cholesterol Cal: 31 mg/dL (ref 5–40)

## 2018-09-28 LAB — TSH: TSH: 5.47 u[IU]/mL — ABNORMAL HIGH (ref 0.450–4.500)

## 2018-10-03 ENCOUNTER — Other Ambulatory Visit: Payer: Self-pay | Admitting: Family Medicine

## 2018-10-04 ENCOUNTER — Encounter: Payer: Self-pay | Admitting: *Deleted

## 2018-10-06 ENCOUNTER — Encounter: Payer: Self-pay | Admitting: Family Medicine

## 2018-10-06 ENCOUNTER — Telehealth: Payer: Self-pay | Admitting: Family Medicine

## 2018-10-06 MED ORDER — LEVOTHYROXINE SODIUM 50 MCG PO TABS: 50.0000 ug | ORAL_TABLET | Freq: Every day | ORAL | 0 refills | Status: DC
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
## 2018-10-06 NOTE — Telephone Encounter (Signed)
Pt. Thyroid test sowed underactivity. Needs to start medication. That should help with the sweats she is having.

## 2018-10-14 ENCOUNTER — Other Ambulatory Visit: Payer: Self-pay | Admitting: Family Medicine

## 2019-01-30 ENCOUNTER — Other Ambulatory Visit: Payer: Self-pay | Admitting: Family Medicine

## 2019-02-20 ENCOUNTER — Telehealth: Payer: Self-pay

## 2019-02-20 NOTE — Telephone Encounter (Signed)
Left message for pt to call back about switching upcoming appt to video or phone call.

## 2019-02-21 ENCOUNTER — Telehealth (INDEPENDENT_AMBULATORY_CARE_PROVIDER_SITE_OTHER): Payer: Medicare Other | Admitting: Cardiovascular Disease

## 2019-02-21 ENCOUNTER — Telehealth: Payer: Self-pay | Admitting: *Deleted

## 2019-02-21 ENCOUNTER — Other Ambulatory Visit: Payer: Self-pay

## 2019-02-21 ENCOUNTER — Encounter: Payer: Self-pay | Admitting: Cardiovascular Disease

## 2019-02-21 VITALS — Ht 64.0 in | Wt 154.0 lb

## 2019-02-21 DIAGNOSIS — I359 Nonrheumatic aortic valve disorder, unspecified: Secondary | ICD-10-CM
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
## 2019-02-21 NOTE — Patient Instructions (Signed)
Medication Instructions:  Your provider recommends that you continue on your current medications as directed. Please refer to the Current Medication list given to you today.    Testing/Procedures: Your provider has requested that you have an echocardiogram in 1 year. Echocardiography is a painless test that uses sound waves to create images of your heart. It provides your doctor with information about the size and shape of your heart and how well your heart's chambers and valves are working. This procedure takes approximately one hour. There are no restrictions for this procedure.    Follow-Up: You will be called to arrange your 1 year echo and office visit with Dr. Burt Knack when his June 2021 schedule is available.

## 2019-02-21 NOTE — Addendum Note (Signed)
Addended by: Harland German A on: 02/21/2019 03:12 PM   Modules accepted: Orders

## 2019-02-21 NOTE — Telephone Encounter (Signed)
Spoke with patient and he says that he will do a phone call visit with Dr Burt Knack. Verbal consent obtained as below.   ..   Virtual Visit Pre-Appointment Phone Call  "(Name), I am calling you today to discuss your upcoming appointment. We are currently trying to limit exposure to the virus that causes COVID-19 by seeing patients at home rather than in the office."  1. "What is the BEST phone number to call the day of the visit?" - include this in appointment notes  2. "Do you have or have access to (through a family member/friend) a smartphone with video capability that we can use for your visit?" a. If yes - list this number in appt notes as "cell" (if different from BEST phone #) and list the appointment type as a VIDEO visit in appointment notes b. If no - list the appointment type as a PHONE visit in appointment notes  3. Confirm consent - "In the setting of the current Covid19 crisis, you are scheduled for a (phone or video) visit with your provider on (date) at (time).  Just as we do with many in-office visits, in order for you to participate in this visit, we must obtain consent.  If you'd like, I can send this to your mychart (if signed up) or email for you to review.  Otherwise, I can obtain your verbal consent now.  All virtual visits are billed to your insurance company just like a normal visit would be.  By agreeing to a virtual visit, we'd like you to understand that the technology does not allow for your provider to perform an examination, and thus may limit your provider's ability to fully assess your condition. If your provider identifies any concerns that need to be evaluated in person, we will make arrangements to do so.  Finally, though the technology is pretty good, we cannot assure that it will always work on either your or our end, and in the setting of a video visit, we may have to convert it to a phone-only visit.  In either situation, we cannot ensure that we have a secure  connection.  Are you willing to proceed?" STAFF: Did the patient verbally acknowledge consent to telehealth visit? Document YES/NO here: YES  4. Advise patient to be prepared - "Two hours prior to your appointment, go ahead and check your blood pressure, pulse, oxygen saturation, and your weight (if you have the equipment to check those) and write them all down. When your visit starts, your provider will ask you for this information. If you have an Apple Watch or Kardia device, please plan to have heart rate information ready on the day of your appointment. Please have a pen and paper handy nearby the day of the visit as well."  5. Give patient instructions for MyChart download to smartphone OR Doximity/Doxy.me as below if video visit (depending on what platform provider is using)  6. Inform patient they will receive a phone call 15 minutes prior to their appointment time (may be from unknown caller ID) so they should be prepared to answer    [facility] has been deemed a candidate for a follow-up tele-health visit to limit community exposure during the Covid-19 pandemic. I spoke with the patient via phone to ensure availability of phone/video source, confirm preferred email & phone number, and discuss instructions and expectations.  I reminded [name] to be prepared with any vital sign and/or heart rhythm information that  could potentially be obtained via home monitoring, at the time of his visit. I reminded [name] to expect a phone call prior to his visit.  [name], CMA 02/21/2019 2:07 PM   INSTRUCTIONS FOR DOWNLOADING THE MYCHART APP TO SMARTPHONE  - The patient must first make sure to have activated MyChart and know their login information - If Apple, go to [employer] and type in MyChart in the search bar and download the app. If Android, ask patient to go to [employer] and type in [place] in the search bar and download the  app. The app is free but as with any other app downloads, their phone may require them to verify saved payment information or Apple/Android password.  - The patient will need to then log into the app with their MyChart username and password, and select [facility] as their healthcare provider to link the account. When it is time for your visit, go to the MyChart app, find appointments, and click Begin Video Visit. Be sure to Select Allow for your device to access the Microphone and Camera for your visit. You will then be connected, and your provider will be with you shortly.  **If they have any issues connecting, or need assistance please contact MyChart service desk [phone])**  **If using a computer, in order to ensure the best quality for their visit they will need to use either of the following Internet Browsers: [employer], or Google Chrome**  IF USING DOXIMITY or DOXY.ME - The patient will receive a link just prior to their visit by text.     FULL LENGTH CONSENT FOR TELE-HEALTH VISIT   I hereby voluntarily request, consent and authorize [facility] and its employed or contracted physicians, physician assistants, nurse practitioners or other licensed health care professionals (the Practitioner), to provide me with telemedicine health care services (the "Services") as deemed necessary by the treating Practitioner. I acknowledge and consent to receive the Services by the Practitioner via telemedicine. I understand that the telemedicine visit will involve communicating with the Practitioner through live audiovisual communication technology and the disclosure of certain medical information by electronic transmission. I acknowledge that I have been given the opportunity to request an in-person assessment or other available alternative prior to the telemedicine visit and am voluntarily participating in the telemedicine visit.  I understand that I have the right to withhold or  withdraw my consent to the use of telemedicine in the course of my care at any time, without affecting my right to future care or treatment, and that the Practitioner or I may terminate the telemedicine visit at any time. I understand that I have the right to inspect all information obtained and/or recorded in the course of the telemedicine visit and may receive copies of available information for a reasonable fee.  I understand that some of the potential risks of receiving the Services via telemedicine include:  Marland Kitchen Delay or interruption in medical evaluation due to technological equipment failure or disruption; . Information transmitted may not be sufficient (e.g. poor resolution of images) to allow for appropriate medical decision making by the Practitioner; and/or  . In rare instances, security protocols could fail, causing a breach of personal health information.  Furthermore, I acknowledge that it is my responsibility to provide information about my medical history, conditions and care that is complete and accurate to the best of my ability. I acknowledge that Practitioner's advice, recommendations, and/or decision may be based on factors not  within their control, such as incomplete or inaccurate data provided by me or distortions of diagnostic images or specimens that may result from electronic transmissions. I understand that the practice of medicine is not an exact science and that Practitioner makes no warranties or guarantees regarding treatment outcomes. I acknowledge that I will receive a copy of this consent concurrently upon execution via email to the email address I last provided but may also request a printed copy by calling the office of [facility].    I understand that my insurance will be billed for this visit.   I have read or had this consent read to me. . I understand the contents of this consent, which adequately explains the benefits and risks of the Services being provided via  telemedicine.  . I have been provided ample opportunity to ask questions regarding this consent and the Services and have had my questions answered to my satisfaction. . I give my informed consent for the services to be provided through the use of telemedicine in my medical care  By participating in this telemedicine visit I agree to the above.

## 2019-02-28 ENCOUNTER — Other Ambulatory Visit: Payer: Self-pay | Admitting: Family Medicine

## 2019-03-26 ENCOUNTER — Encounter: Payer: Self-pay | Admitting: Family Medicine

## 2019-03-26 ENCOUNTER — Ambulatory Visit (INDEPENDENT_AMBULATORY_CARE_PROVIDER_SITE_OTHER): Payer: Medicare Other | Admitting: Family Medicine

## 2019-03-26 ENCOUNTER — Other Ambulatory Visit: Payer: Self-pay

## 2019-03-26 VITALS — BP 133/88 | HR 64 | Temp 98.3°F | Ht 64.0 in | Wt 165.0 lb

## 2019-03-26 DIAGNOSIS — M1990 Unspecified osteoarthritis, unspecified site: Secondary | ICD-10-CM | POA: Diagnosis not present

## 2019-03-26 DIAGNOSIS — I509 Heart failure, unspecified: Secondary | ICD-10-CM

## 2019-03-26 DIAGNOSIS — E7800 Pure hypercholesterolemia, unspecified: Secondary | ICD-10-CM

## 2019-03-26 DIAGNOSIS — Z952 Presence of prosthetic heart valve: Secondary | ICD-10-CM | POA: Diagnosis not present

## 2019-03-26 DIAGNOSIS — I10 Essential (primary) hypertension: Secondary | ICD-10-CM | POA: Diagnosis not present

## 2019-03-26 MED ORDER — NABUMETONE 500 MG PO TABS: 1000.0000 mg | ORAL_TABLET | Freq: Two times a day (BID) | ORAL | 1 refills | Status: DC

## 2019-03-26 NOTE — Patient Instructions (Signed)
Return in 6 months
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
## 2019-03-27 LAB — CBC WITH DIFFERENTIAL/PLATELET
Basophils Absolute: 0 10*3/uL (ref 0.0–0.2)
Basos: 1 %
EOS (ABSOLUTE): 0.3 10*3/uL (ref 0.0–0.4)
Eos: 5 %
Hematocrit: 42 % (ref 37.5–51.0)
Hemoglobin: 14.5 g/dL (ref 13.0–17.7)
Immature Grans (Abs): 0 10*3/uL (ref 0.0–0.1)
Immature Granulocytes: 0 %
Lymphocytes Absolute: 2.1 10*3/uL (ref 0.7–3.1)
Lymphs: 39 %
MCH: 31 pg (ref 26.6–33.0)
MCHC: 34.5 g/dL (ref 31.5–35.7)
MCV: 90 fL (ref 79–97)
Monocytes Absolute: 0.5 10*3/uL (ref 0.1–0.9)
Monocytes: 10 %
Neutrophils Absolute: 2.4 10*3/uL (ref 1.4–7.0)
Neutrophils: 45 %
Platelets: 221 10*3/uL (ref 150–450)
RBC: 4.67 x10E6/uL (ref 4.14–5.80)
RDW: 13.6 % (ref 11.6–15.4)
WBC: 5.4 10*3/uL (ref 3.4–10.8)

## 2019-03-27 LAB — CMP14+EGFR
ALT: 29 IU/L (ref 0–44)
AST: 30 IU/L (ref 0–40)
Albumin/Globulin Ratio: 2 (ref 1.2–2.2)
Albumin: 4.4 g/dL (ref 3.8–4.9)
Alkaline Phosphatase: 95 IU/L (ref 39–117)
BUN/Creatinine Ratio: 18 (ref 9–20)
BUN: 13 mg/dL (ref 6–24)
Bilirubin Total: 1.1 mg/dL (ref 0.0–1.2)
CO2: 20 mmol/L (ref 20–29)
Calcium: 9.3 mg/dL (ref 8.7–10.2)
Chloride: 101 mmol/L (ref 96–106)
Creatinine, Ser: 0.71 mg/dL — ABNORMAL LOW (ref 0.76–1.27)
GFR calc Af Amer: 120 mL/min/{1.73_m2} (ref >59)
GFR calc non Af Amer: 104 mL/min/{1.73_m2} (ref >59)
Globulin, Total: 2.2 g/dL (ref 1.5–4.5)
Glucose: 93 mg/dL (ref 65–99)
Potassium: 4.2 mmol/L (ref 3.5–5.2)
Sodium: 141 mmol/L (ref 134–144)
Total Protein: 6.6 g/dL (ref 6.0–8.5)

## 2019-03-27 LAB — LIPID PANEL
Chol/HDL Ratio: 4.6 ratio (ref 0.0–5.0)
Cholesterol, Total: 212 mg/dL — ABNORMAL HIGH (ref 100–199)
HDL: 46 mg/dL (ref >39)
LDL Calculated: 134 mg/dL — ABNORMAL HIGH (ref 0–99)
Triglycerides: 158 mg/dL — ABNORMAL HIGH (ref 0–149)
VLDL Cholesterol Cal: 32 mg/dL (ref 5–40)

## 2019-03-28 ENCOUNTER — Other Ambulatory Visit: Payer: Self-pay | Admitting: Family Medicine

## 2019-03-28 ENCOUNTER — Encounter: Payer: Self-pay | Admitting: *Deleted

## 2019-03-28 DIAGNOSIS — I10 Essential (primary) hypertension: Secondary | ICD-10-CM

## 2019-05-19 ENCOUNTER — Other Ambulatory Visit: Payer: Self-pay | Admitting: Family Medicine

## 2019-06-03 ENCOUNTER — Other Ambulatory Visit: Payer: Self-pay | Admitting: Cardiovascular Disease

## 2019-09-27 ENCOUNTER — Other Ambulatory Visit: Payer: Self-pay | Admitting: Family Medicine

## 2019-10-16 ENCOUNTER — Encounter: Payer: Self-pay | Admitting: Family Medicine

## 2019-10-16 ENCOUNTER — Ambulatory Visit (INDEPENDENT_AMBULATORY_CARE_PROVIDER_SITE_OTHER): Payer: Medicare Other | Admitting: Family Medicine

## 2019-10-16 ENCOUNTER — Other Ambulatory Visit: Payer: Self-pay

## 2019-10-16 VITALS — BP 161/93 | HR 65 | Temp 97.8°F | Ht 64.0 in | Wt 169.2 lb

## 2019-10-16 DIAGNOSIS — I10 Essential (primary) hypertension: Secondary | ICD-10-CM

## 2019-10-16 DIAGNOSIS — K219 Gastro-esophageal reflux disease without esophagitis: Secondary | ICD-10-CM | POA: Diagnosis not present

## 2019-10-16 DIAGNOSIS — G459 Transient cerebral ischemic attack, unspecified: Secondary | ICD-10-CM

## 2019-10-16 DIAGNOSIS — Z952 Presence of prosthetic heart valve: Secondary | ICD-10-CM

## 2019-10-16 LAB — CMP14+EGFR
ALT: 30 IU/L (ref 0–44)
AST: 29 IU/L (ref 0–40)
Albumin/Globulin Ratio: 1.8 (ref 1.2–2.2)
Albumin: 4.6 g/dL (ref 3.8–4.9)
Alkaline Phosphatase: 108 IU/L (ref 39–117)
BUN/Creatinine Ratio: 12 (ref 9–20)
BUN: 10 mg/dL (ref 6–24)
Bilirubin Total: 1 mg/dL (ref 0.0–1.2)
CO2: 21 mmol/L (ref 20–29)
Calcium: 10 mg/dL (ref 8.7–10.2)
Chloride: 100 mmol/L (ref 96–106)
Creatinine, Ser: 0.83 mg/dL (ref 0.76–1.27)
GFR calc Af Amer: 113 mL/min/{1.73_m2} (ref >59)
GFR calc non Af Amer: 98 mL/min/{1.73_m2} (ref >59)
Globulin, Total: 2.5 g/dL (ref 1.5–4.5)
Glucose: 97 mg/dL (ref 65–99)
Potassium: 4.5 mmol/L (ref 3.5–5.2)
Sodium: 139 mmol/L (ref 134–144)
Total Protein: 7.1 g/dL (ref 6.0–8.5)

## 2019-10-16 LAB — CBC WITH DIFFERENTIAL/PLATELET
Basophils Absolute: 0 10*3/uL (ref 0.0–0.2)
Basos: 1 %
EOS (ABSOLUTE): 0.2 10*3/uL (ref 0.0–0.4)
Eos: 3 %
Hematocrit: 42.9 % (ref 37.5–51.0)
Hemoglobin: 15.4 g/dL (ref 13.0–17.7)
Immature Grans (Abs): 0 10*3/uL (ref 0.0–0.1)
Immature Granulocytes: 0 %
Lymphocytes Absolute: 1.9 10*3/uL (ref 0.7–3.1)
Lymphs: 41 %
MCH: 31.4 pg (ref 26.6–33.0)
MCHC: 35.9 g/dL — ABNORMAL HIGH (ref 31.5–35.7)
MCV: 87 fL (ref 79–97)
Monocytes Absolute: 0.4 10*3/uL (ref 0.1–0.9)
Monocytes: 8 %
Neutrophils Absolute: 2.2 10*3/uL (ref 1.4–7.0)
Neutrophils: 47 %
Platelets: 227 10*3/uL (ref 150–450)
RBC: 4.91 x10E6/uL (ref 4.14–5.80)
RDW: 13 % (ref 11.6–15.4)
WBC: 4.6 10*3/uL (ref 3.4–10.8)

## 2019-10-16 LAB — LIPID PANEL
Chol/HDL Ratio: 4.8 ratio (ref 0.0–5.0)
Cholesterol, Total: 234 mg/dL — ABNORMAL HIGH (ref 100–199)
HDL: 49 mg/dL (ref >39)
LDL Chol Calc (NIH): 161 mg/dL — ABNORMAL HIGH (ref 0–99)
Triglycerides: 135 mg/dL (ref 0–149)
VLDL Cholesterol Cal: 24 mg/dL (ref 5–40)

## 2019-10-16 MED ORDER — ASPIRIN EC 325 MG PO TBEC: 325.0000 mg | DELAYED_RELEASE_TABLET | Freq: Every day | ORAL | 0 refills | Status: DC

## 2019-10-16 MED ORDER — CLOPIDOGREL BISULFATE 75 MG PO TABS: 75.0000 mg | ORAL_TABLET | Freq: Every day | ORAL | 3 refills | Status: DC
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
## 2019-10-19 MED ORDER — NABUMETONE 500 MG PO TABS: 1000.0000 mg | ORAL_TABLET | Freq: Two times a day (BID) | ORAL | 1 refills | Status: DC

## 2019-10-19 MED ORDER — METOPROLOL SUCCINATE ER 100 MG PO TB24: ORAL_TABLET | ORAL | 1 refills | Status: DC

## 2019-10-19 MED ORDER — LISINOPRIL 20 MG PO TABS: 20.0000 mg | ORAL_TABLET | Freq: Every day | ORAL | 1 refills | Status: DC

## 2019-10-19 MED ORDER — OMEPRAZOLE 40 MG PO CPDR: 40.0000 mg | DELAYED_RELEASE_CAPSULE | Freq: Every day | ORAL | 0 refills | Status: DC

## 2019-10-20 ENCOUNTER — Other Ambulatory Visit: Payer: Self-pay | Admitting: Family Medicine

## 2019-10-20 ENCOUNTER — Telehealth: Payer: Self-pay | Admitting: Family Medicine

## 2019-10-20 MED ORDER — ATORVASTATIN CALCIUM 40 MG PO TABS: 40.0000 mg | ORAL_TABLET | Freq: Every day | ORAL | 3 refills | Status: DC

## 2019-10-20 NOTE — Telephone Encounter (Signed)
Pt called stating that he missed call from [place] regarding his lab results. Went over lab results with pt per Dr Livia Snellen notes. Pt said he was ok with taking Atorvastation 40 mg Rx as recommended by Dr Livia Snellen. Pt wants to speak with someone about when and where he is supposed to be sent for his neck that Dr Livia Snellen talked to him about.

## 2019-10-20 NOTE — Telephone Encounter (Signed)
Taken care of [name] has already spoke with patient

## 2019-10-20 NOTE — Telephone Encounter (Signed)
I ordered a carotid ultrasound. Please make sure Loma Sousa got the order. Thanks, WS

## 2019-10-20 NOTE — Telephone Encounter (Signed)
I did not see a referral placed for patients neck. Where were you sending and I can order referral for you?

## 2019-10-24 ENCOUNTER — Other Ambulatory Visit: Payer: Self-pay

## 2019-10-24 ENCOUNTER — Ambulatory Visit (HOSPITAL_COMMUNITY)
Admission: RE | Admit: 2019-10-24 | Discharge: 2019-10-24 | Disposition: A | Discharge status: Home / Self Care | Payer: Medicare Other | Source: Ambulatory Visit | Attending: Family Medicine | Admitting: Family Medicine

## 2019-10-24 DIAGNOSIS — Z952 Presence of prosthetic heart valve: Secondary | ICD-10-CM | POA: Diagnosis not present

## 2019-10-24 DIAGNOSIS — I10 Essential (primary) hypertension: Secondary | ICD-10-CM | POA: Diagnosis not present

## 2019-10-24 DIAGNOSIS — G459 Transient cerebral ischemic attack, unspecified: Secondary | ICD-10-CM | POA: Insufficient documentation

## 2019-10-24 DIAGNOSIS — R531 Weakness: Secondary | ICD-10-CM | POA: Diagnosis not present

## 2019-10-27 ENCOUNTER — Telehealth: Payer: Self-pay | Admitting: Family Medicine

## 2019-10-27 NOTE — Telephone Encounter (Signed)
Patient has called to find out the results of his carotid ultrasound.

## 2019-10-27 NOTE — Telephone Encounter (Signed)
Patient aware.

## 2019-10-27 NOTE — Telephone Encounter (Signed)
It turned out normal. Please let him know. Thanks, WS

## 2019-11-02 ENCOUNTER — Other Ambulatory Visit: Payer: Self-pay | Admitting: Family Medicine

## 2019-11-02 DIAGNOSIS — I10 Essential (primary) hypertension: Secondary | ICD-10-CM

## 2019-11-02 DIAGNOSIS — G459 Transient cerebral ischemic attack, unspecified: Secondary | ICD-10-CM

## 2019-11-03 ENCOUNTER — Encounter (HOSPITAL_COMMUNITY): Payer: Self-pay | Admitting: *Deleted

## 2019-11-03 ENCOUNTER — Other Ambulatory Visit: Payer: Self-pay

## 2019-11-03 ENCOUNTER — Emergency Department (HOSPITAL_COMMUNITY)
Admission: EM | Admit: 2019-11-03 | Discharge: 2019-11-04 | Disposition: A | Discharge status: Home / Self Care | Payer: Medicare Other | Attending: Emergency Medicine | Admitting: Emergency Medicine

## 2019-11-03 DIAGNOSIS — M79662 Pain in left lower leg: Secondary | ICD-10-CM

## 2019-11-03 DIAGNOSIS — M1712 Unilateral primary osteoarthritis, left knee: Secondary | ICD-10-CM | POA: Diagnosis not present

## 2019-11-03 DIAGNOSIS — Z7982 Long term (current) use of aspirin: Secondary | ICD-10-CM | POA: Diagnosis not present

## 2019-11-03 DIAGNOSIS — H81399 Other peripheral vertigo, unspecified ear: Secondary | ICD-10-CM

## 2019-11-03 DIAGNOSIS — R42 Dizziness and giddiness: Secondary | ICD-10-CM | POA: Diagnosis not present

## 2019-11-03 DIAGNOSIS — Z79899 Other long term (current) drug therapy: Secondary | ICD-10-CM | POA: Diagnosis not present

## 2019-11-03 DIAGNOSIS — Z954 Presence of other heart-valve replacement: Secondary | ICD-10-CM | POA: Insufficient documentation

## 2019-11-03 DIAGNOSIS — M79661 Pain in right lower leg: Secondary | ICD-10-CM | POA: Diagnosis not present

## 2019-11-03 DIAGNOSIS — I10 Essential (primary) hypertension: Secondary | ICD-10-CM | POA: Diagnosis not present

## 2019-11-03 NOTE — ED Triage Notes (Signed)
Pt states he was watching tv around 2030 this evening when he started having an episode of dizziness and it looked like the tv was going to fall over; pt states he took his BP and it was 150/114; pt states he still feels dizzy but not as bad

## 2019-11-04 ENCOUNTER — Emergency Department (HOSPITAL_COMMUNITY): Payer: Medicare Other

## 2019-11-04 DIAGNOSIS — M1712 Unilateral primary osteoarthritis, left knee: Secondary | ICD-10-CM | POA: Diagnosis not present

## 2019-11-04 DIAGNOSIS — I10 Essential (primary) hypertension: Secondary | ICD-10-CM | POA: Diagnosis not present

## 2019-11-04 DIAGNOSIS — M79661 Pain in right lower leg: Secondary | ICD-10-CM | POA: Diagnosis not present

## 2019-11-04 DIAGNOSIS — M79662 Pain in left lower leg: Secondary | ICD-10-CM | POA: Diagnosis not present

## 2019-11-04 DIAGNOSIS — R42 Dizziness and giddiness: Secondary | ICD-10-CM | POA: Diagnosis not present

## 2019-11-04 LAB — CBC WITH DIFFERENTIAL/PLATELET
Abs Immature Granulocytes: 0.02 10*3/uL (ref 0.00–0.07)
Basophils Absolute: 0.1 10*3/uL (ref 0.0–0.1)
Basophils Relative: 1 %
Eosinophils Absolute: 0.3 10*3/uL (ref 0.0–0.5)
Eosinophils Relative: 4 %
HCT: 42 % (ref 39.0–52.0)
Hemoglobin: 14.5 g/dL (ref 13.0–17.0)
Immature Granulocytes: 0 %
Lymphocytes Relative: 43 %
Lymphs Abs: 2.9 10*3/uL (ref 0.7–4.0)
MCH: 31.1 pg (ref 26.0–34.0)
MCHC: 34.5 g/dL (ref 30.0–36.0)
MCV: 90.1 fL (ref 80.0–100.0)
Monocytes Absolute: 0.6 10*3/uL (ref 0.1–1.0)
Monocytes Relative: 9 %
Neutro Abs: 2.9 10*3/uL (ref 1.7–7.7)
Neutrophils Relative %: 43 %
Platelets: 246 10*3/uL (ref 150–400)
RBC: 4.66 MIL/uL (ref 4.22–5.81)
RDW: 13.1 % (ref 11.5–15.5)
WBC: 6.8 10*3/uL (ref 4.0–10.5)
nRBC: 0 % (ref 0.0–0.2)

## 2019-11-04 LAB — BASIC METABOLIC PANEL
Anion gap: 7 (ref 5–15)
BUN: 14 mg/dL (ref 6–20)
CO2: 27 mmol/L (ref 22–32)
Calcium: 9.1 mg/dL (ref 8.9–10.3)
Chloride: 105 mmol/L (ref 98–111)
Creatinine, Ser: 0.69 mg/dL (ref 0.61–1.24)
GFR calc Af Amer: >60 mL/min (ref >60)
GFR calc non Af Amer: >60 mL/min (ref >60)
Glucose, Bld: 119 mg/dL — ABNORMAL HIGH (ref 70–99)
Potassium: 3.9 mmol/L (ref 3.5–5.1)
Sodium: 139 mmol/L (ref 135–145)

## 2019-11-04 MED ORDER — MECLIZINE HCL 12.5 MG PO TABS
25.0000 mg | ORAL_TABLET | Freq: Once | ORAL | Status: AC
  Administered 2019-11-04: 25 mg via ORAL
  Filled 2019-11-04: qty 2

## 2019-11-04 MED ORDER — MECLIZINE HCL 25 MG PO TABS: 25.0000 mg | ORAL_TABLET | Freq: Three times a day (TID) | ORAL | 0 refills | Status: DC | PRN

## 2019-11-04 NOTE — Discharge Instructions (Addendum)
Please monitor your blood pressure at home. If it is staying high, you will need to adjust your medication.

## 2019-11-04 NOTE — ED Notes (Signed)
Pt ambulatory to BR without any assistance and with a steady gait.
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
## 2019-12-09 ENCOUNTER — Telehealth: Payer: Self-pay | Admitting: Family Medicine

## 2019-12-09 NOTE — Chronic Care Management (AMB) (Signed)
  Chronic Care Management   Outreach Note  12/09/2019 Name: [name] MRN: [id number] DOB: 07/04/1961  [name] is a 59 y.o. year old male who is a primary care patient of Stacks, Cletus Gash, MD. I reached out to [employer] by phone today in response to a referral sent by [name]'s health plan.     An unsuccessful telephone outreach was attempted today. The patient was referred to the case management team for assistance with care management and care coordination.   Follow Up Plan: A HIPPA compliant phone message was left for the patient providing contact information and requesting a return call. The care management team will reach out to the patient again over the next 7 days. If patient returns call to provider office, please advise to call [place] at [phone].  [place], [place] [address] Direct Dial: [phone] [email or web address]: [email or web address]

## 2019-12-22 NOTE — Chronic Care Management (AMB) (Signed)
  Chronic Care Management   Outreach Note  12/22/2019 Name: [name] MRN: [id number] DOB: [birth year]  [name] is a 59 y.o. year old male who is a primary care patient of Stacks, Cletus Gash, MD. I reached out to [employer] by phone today in response to a referral sent by Mr. Graeme Y [place]aston's health plan.     A second unsuccessful telephone outreach was attempted today. The patient was referred to the case management team for assistance with care management and care coordination.   Follow Up Plan: A HIPPA compliant phone message was left for the patient providing contact information and requesting a return call.  The care management team will reach out to the patient again over the next 7 days. If patient returns call to provider office, please advise to call [place] at [phone].  [place], [place] [address] Direct Dial: [phone] [email or web address]: [email or web address]

## 2019-12-24 NOTE — Chronic Care Management (AMB) (Signed)
  Chronic Care Management   Note  12/24/2019 Name: HERVE HAUG MRN: [id number] DOB: 08/07/1961  Tyhir CHASKE PASKETT is a 59 y.o. year old male who is a primary care patient of Stacks, Cletus Gash, MD. I reached out to [employer] by phone today in response to a referral sent by Mr. Jahmir Y Weyrauch's health plan.     Mr. Fullwood was given information about Chronic Care Management services today including:  1. CCM service includes personalized support from designated clinical staff supervised by his physician, including individualized plan of care and coordination with other care providers 2. 24/7 contact phone numbers for assistance for urgent and routine care needs. 3. Service will only be billed when office clinical staff spend 20 minutes or more in a month to coordinate care. 4. Only one practitioner may furnish and bill the service in a calendar month. 5. The patient may stop CCM services at any time (effective at the end of the month) by phone call to the office staff. 6. The patient will be responsible for cost sharing (co-pay) of up to 20% of the service fee (after annual deductible is met).  Patient did not agree to enrollment in care management services and does not wish to consider at this time.  Follow up plan: The patient has been provided with contact information for the care management team and has been advised to call with any health related questions or concerns.   [place], [place] [address] Direct Dial: [phone] [email or web address] Website: [email or web address]

## 2020-02-01 DIAGNOSIS — H5213 Myopia, bilateral: Secondary | ICD-10-CM | POA: Diagnosis not present

## 2020-02-12 ENCOUNTER — Encounter: Payer: Self-pay | Admitting: *Deleted

## 2020-02-27 ENCOUNTER — Other Ambulatory Visit: Payer: Self-pay

## 2020-02-27 ENCOUNTER — Ambulatory Visit (HOSPITAL_COMMUNITY): Payer: Medicare Other | Attending: Cardiology

## 2020-02-27 ENCOUNTER — Ambulatory Visit (INDEPENDENT_AMBULATORY_CARE_PROVIDER_SITE_OTHER): Payer: Medicare Other | Admitting: Cardiovascular Disease

## 2020-02-27 ENCOUNTER — Encounter: Payer: Self-pay | Admitting: Cardiovascular Disease

## 2020-02-27 VITALS — BP 120/80 | HR 53 | Ht 66.0 in | Wt 165.0 lb

## 2020-02-27 DIAGNOSIS — I10 Essential (primary) hypertension: Secondary | ICD-10-CM | POA: Diagnosis not present

## 2020-02-27 DIAGNOSIS — I359 Nonrheumatic aortic valve disorder, unspecified: Secondary | ICD-10-CM

## 2020-02-27 MED ORDER — PERFLUTREN LIPID MICROSPHERE
1.0000 mL | INTRAVENOUS | Status: AC | PRN
  Administered 2020-02-27: 1 mL via INTRAVENOUS
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
## 2020-02-27 NOTE — Patient Instructions (Signed)
Medication Instructions:  Your provider recommends that you continue on your current medications as directed. Please refer to the Current Medication list given to you today.   *If you need a refill on your cardiac medications before your next appointment, please call your pharmacy*   Follow-Up: You will be called to arrange your 1 year echo and office visit. 

## 2020-04-06 ENCOUNTER — Encounter: Payer: Self-pay | Admitting: Family Medicine

## 2020-04-06 ENCOUNTER — Other Ambulatory Visit: Payer: Self-pay

## 2020-04-06 ENCOUNTER — Ambulatory Visit (INDEPENDENT_AMBULATORY_CARE_PROVIDER_SITE_OTHER): Payer: Medicare Other | Admitting: Family Medicine

## 2020-04-06 VITALS — BP 122/85 | HR 53 | Temp 97.2°F | Ht 66.0 in | Wt 163.2 lb

## 2020-04-06 DIAGNOSIS — I10 Essential (primary) hypertension: Secondary | ICD-10-CM

## 2020-04-06 DIAGNOSIS — G459 Transient cerebral ischemic attack, unspecified: Secondary | ICD-10-CM

## 2020-04-06 DIAGNOSIS — M2550 Pain in unspecified joint: Secondary | ICD-10-CM | POA: Diagnosis not present

## 2020-04-06 DIAGNOSIS — F411 Generalized anxiety disorder: Secondary | ICD-10-CM

## 2020-04-06 DIAGNOSIS — Z952 Presence of prosthetic heart valve: Secondary | ICD-10-CM | POA: Diagnosis not present

## 2020-04-06 DIAGNOSIS — K219 Gastro-esophageal reflux disease without esophagitis: Secondary | ICD-10-CM | POA: Diagnosis not present

## 2020-04-06 MED ORDER — LISINOPRIL 20 MG PO TABS: 20.0000 mg | ORAL_TABLET | Freq: Every day | ORAL | 1 refills | Status: DC

## 2020-04-06 MED ORDER — OMEPRAZOLE 40 MG PO CPDR: 40.0000 mg | DELAYED_RELEASE_CAPSULE | Freq: Every day | ORAL | 1 refills | Status: DC

## 2020-04-06 MED ORDER — METOPROLOL SUCCINATE ER 100 MG PO TB24: ORAL_TABLET | ORAL | 1 refills | Status: DC
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
## 2020-04-07 LAB — CMP14+EGFR
ALT: 26 IU/L (ref 0–44)
AST: 26 IU/L (ref 0–40)
Albumin/Globulin Ratio: 1.8 (ref 1.2–2.2)
Albumin: 4.5 g/dL (ref 3.8–4.9)
Alkaline Phosphatase: 93 IU/L (ref 48–121)
BUN/Creatinine Ratio: 13 (ref 9–20)
BUN: 10 mg/dL (ref 6–24)
Bilirubin Total: 1 mg/dL (ref 0.0–1.2)
CO2: 23 mmol/L (ref 20–29)
Calcium: 9.4 mg/dL (ref 8.7–10.2)
Chloride: 101 mmol/L (ref 96–106)
Creatinine, Ser: 0.75 mg/dL — ABNORMAL LOW (ref 0.76–1.27)
GFR calc Af Amer: 117 mL/min/{1.73_m2} (ref >59)
GFR calc non Af Amer: 101 mL/min/{1.73_m2} (ref >59)
Globulin, Total: 2.5 g/dL (ref 1.5–4.5)
Glucose: 95 mg/dL (ref 65–99)
Potassium: 4.5 mmol/L (ref 3.5–5.2)
Sodium: 141 mmol/L (ref 134–144)
Total Protein: 7 g/dL (ref 6.0–8.5)

## 2020-04-07 LAB — CBC WITH DIFFERENTIAL/PLATELET
Basophils Absolute: 0 10*3/uL (ref 0.0–0.2)
Basos: 1 %
EOS (ABSOLUTE): 0.3 10*3/uL (ref 0.0–0.4)
Eos: 5 %
Hematocrit: 42.4 % (ref 37.5–51.0)
Hemoglobin: 14.5 g/dL (ref 13.0–17.7)
Immature Grans (Abs): 0 10*3/uL (ref 0.0–0.1)
Immature Granulocytes: 0 %
Lymphocytes Absolute: 2.5 10*3/uL (ref 0.7–3.1)
Lymphs: 42 %
MCH: 30.5 pg (ref 26.6–33.0)
MCHC: 34.2 g/dL (ref 31.5–35.7)
MCV: 89 fL (ref 79–97)
Monocytes Absolute: 0.5 10*3/uL (ref 0.1–0.9)
Monocytes: 9 %
Neutrophils Absolute: 2.5 10*3/uL (ref 1.4–7.0)
Neutrophils: 43 %
Platelets: 220 10*3/uL (ref 150–450)
RBC: 4.75 x10E6/uL (ref 4.14–5.80)
RDW: 13.6 % (ref 11.6–15.4)
WBC: 5.8 10*3/uL (ref 3.4–10.8)

## 2020-04-07 LAB — SEDIMENTATION RATE: Sed Rate: 5 mm/hr (ref 0–30)

## 2020-04-07 NOTE — Progress Notes (Signed)
Hello Ernest,  Your lab result is normal and/or stable.Some minor variations that are not significant are commonly marked abnormal, but do not represent any medical problem for you.  Best regards, Claretta Fraise, M.D.

## 2020-04-08 ENCOUNTER — Other Ambulatory Visit: Payer: Self-pay | Admitting: Family Medicine

## 2020-04-08 DIAGNOSIS — I10 Essential (primary) hypertension: Secondary | ICD-10-CM

## 2020-04-08 DIAGNOSIS — G459 Transient cerebral ischemic attack, unspecified: Secondary | ICD-10-CM

## 2020-04-14 ENCOUNTER — Ambulatory Visit: Payer: Medicare Other | Admitting: Family Medicine

## 2020-04-22 ENCOUNTER — Telehealth: Payer: Self-pay | Admitting: Cardiovascular Disease

## 2020-04-22 MED ORDER — NITROGLYCERIN 0.4 MG SL SUBL: SUBLINGUAL_TABLET | SUBLINGUAL | 6 refills | Status: DC

## 2020-04-22 NOTE — Telephone Encounter (Signed)
New message      *STAT* If patient is at the pharmacy, call can be transferred to refill team.   1. Which medications need to be refilled? (please list name of each medication and dose if known) nitro  2. Which pharmacy/location (including street and city if local pharmacy) is medication to be sent to? [facility]  3. Do they need a 30 day or 90 day supply? [place]

## 2020-04-22 NOTE — Telephone Encounter (Signed)
Pt's medication was sent to pt's pharmacy as requested. Confirmation received.  °

## 2020-09-14 ENCOUNTER — Other Ambulatory Visit: Payer: Self-pay | Admitting: *Deleted

## 2020-09-14 DIAGNOSIS — I10 Essential (primary) hypertension: Secondary | ICD-10-CM

## 2020-09-14 DIAGNOSIS — K219 Gastro-esophageal reflux disease without esophagitis: Secondary | ICD-10-CM

## 2020-09-14 MED ORDER — OMEPRAZOLE 40 MG PO CPDR: 40.0000 mg | DELAYED_RELEASE_CAPSULE | Freq: Every day | ORAL | 0 refills | Status: DC

## 2020-09-14 MED ORDER — METOPROLOL SUCCINATE ER 100 MG PO TB24: ORAL_TABLET | ORAL | 0 refills | Status: DC

## 2020-10-07 ENCOUNTER — Other Ambulatory Visit: Payer: Self-pay

## 2020-10-07 ENCOUNTER — Ambulatory Visit (INDEPENDENT_AMBULATORY_CARE_PROVIDER_SITE_OTHER): Payer: Medicare Other | Admitting: Family Medicine

## 2020-10-07 ENCOUNTER — Encounter: Payer: Self-pay | Admitting: Family Medicine

## 2020-10-07 VITALS — BP 139/89 | HR 60 | Temp 97.3°F | Ht 66.0 in | Wt 162.8 lb

## 2020-10-07 DIAGNOSIS — E559 Vitamin D deficiency, unspecified: Secondary | ICD-10-CM

## 2020-10-07 DIAGNOSIS — E782 Mixed hyperlipidemia: Secondary | ICD-10-CM | POA: Diagnosis not present

## 2020-10-07 DIAGNOSIS — I10 Essential (primary) hypertension: Secondary | ICD-10-CM

## 2020-10-07 DIAGNOSIS — Z0001 Encounter for general adult medical examination with abnormal findings: Secondary | ICD-10-CM | POA: Diagnosis not present

## 2020-10-07 DIAGNOSIS — K219 Gastro-esophageal reflux disease without esophagitis: Secondary | ICD-10-CM

## 2020-10-07 DIAGNOSIS — Z0000 Encounter for general adult medical examination without abnormal findings: Secondary | ICD-10-CM

## 2020-10-07 DIAGNOSIS — Z125 Encounter for screening for malignant neoplasm of prostate: Secondary | ICD-10-CM

## 2020-10-07 MED ORDER — LISINOPRIL 20 MG PO TABS: 20.0000 mg | ORAL_TABLET | Freq: Every day | ORAL | 1 refills | Status: DC
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
## 2020-10-08 LAB — CMP14+EGFR
ALT: 40 IU/L (ref 0–44)
AST: 48 IU/L — ABNORMAL HIGH (ref 0–40)
Albumin/Globulin Ratio: 1.7 (ref 1.2–2.2)
Albumin: 4.7 g/dL (ref 3.8–4.9)
Alkaline Phosphatase: 113 IU/L (ref 44–121)
BUN/Creatinine Ratio: 16 (ref 9–20)
BUN: 12 mg/dL (ref 6–24)
Bilirubin Total: 0.6 mg/dL (ref 0.0–1.2)
CO2: 23 mmol/L (ref 20–29)
Calcium: 9.4 mg/dL (ref 8.7–10.2)
Chloride: 101 mmol/L (ref 96–106)
Creatinine, Ser: 0.76 mg/dL (ref 0.76–1.27)
GFR calc Af Amer: 116 mL/min/{1.73_m2} (ref >59)
GFR calc non Af Amer: 101 mL/min/{1.73_m2} (ref >59)
Globulin, Total: 2.7 g/dL (ref 1.5–4.5)
Glucose: 91 mg/dL (ref 65–99)
Potassium: 4.3 mmol/L (ref 3.5–5.2)
Sodium: 140 mmol/L (ref 134–144)
Total Protein: 7.4 g/dL (ref 6.0–8.5)

## 2020-10-08 LAB — PSA, TOTAL AND FREE
PSA, Free Pct: 26 %
PSA, Free: 0.26 ng/mL
Prostate Specific Ag, Serum: 1 ng/mL (ref 0.0–4.0)

## 2020-10-08 LAB — CBC WITH DIFFERENTIAL/PLATELET
Basophils Absolute: 0 10*3/uL (ref 0.0–0.2)
Basos: 1 %
EOS (ABSOLUTE): 0.2 10*3/uL (ref 0.0–0.4)
Eos: 4 %
Hematocrit: 41.6 % (ref 37.5–51.0)
Hemoglobin: 13.9 g/dL (ref 13.0–17.7)
Immature Grans (Abs): 0 10*3/uL (ref 0.0–0.1)
Immature Granulocytes: 1 %
Lymphocytes Absolute: 1.3 10*3/uL (ref 0.7–3.1)
Lymphs: 31 %
MCH: 29.6 pg (ref 26.6–33.0)
MCHC: 33.4 g/dL (ref 31.5–35.7)
MCV: 89 fL (ref 79–97)
Monocytes Absolute: 0.4 10*3/uL (ref 0.1–0.9)
Monocytes: 10 %
Neutrophils Absolute: 2.3 10*3/uL (ref 1.4–7.0)
Neutrophils: 53 %
Platelets: 338 10*3/uL (ref 150–450)
RBC: 4.69 x10E6/uL (ref 4.14–5.80)
RDW: 12.2 % (ref 11.6–15.4)
WBC: 4.3 10*3/uL (ref 3.4–10.8)

## 2020-10-08 LAB — LIPID PANEL
Chol/HDL Ratio: 3.4 ratio (ref 0.0–5.0)
Cholesterol, Total: 179 mg/dL (ref 100–199)
HDL: 52 mg/dL (ref >39)
LDL Chol Calc (NIH): 117 mg/dL — ABNORMAL HIGH (ref 0–99)
Triglycerides: 50 mg/dL (ref 0–149)
VLDL Cholesterol Cal: 10 mg/dL (ref 5–40)

## 2020-10-08 LAB — VITAMIN D 25 HYDROXY (VIT D DEFICIENCY, FRACTURES): Vit D, 25-Hydroxy: 38.9 ng/mL (ref 30.0–100.0)

## 2020-10-10 ENCOUNTER — Encounter: Payer: Self-pay | Admitting: Family Medicine

## 2020-10-10 MED ORDER — METOPROLOL SUCCINATE ER 100 MG PO TB24: ORAL_TABLET | ORAL | 0 refills | Status: DC

## 2020-10-10 MED ORDER — ROSUVASTATIN CALCIUM 5 MG PO TABS: 5.0000 mg | ORAL_TABLET | Freq: Every day | ORAL | 3 refills | Status: DC

## 2020-10-10 MED ORDER — OMEPRAZOLE 40 MG PO CPDR: 40.0000 mg | DELAYED_RELEASE_CAPSULE | Freq: Every day | ORAL | 0 refills | Status: DC

## 2021-01-10 ENCOUNTER — Other Ambulatory Visit: Payer: Self-pay | Admitting: Cardiovascular Disease
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
## 2021-02-10 ENCOUNTER — Encounter: Payer: Self-pay | Admitting: Cardiovascular Disease

## 2021-02-10 ENCOUNTER — Other Ambulatory Visit: Payer: Self-pay

## 2021-02-10 ENCOUNTER — Ambulatory Visit (INDEPENDENT_AMBULATORY_CARE_PROVIDER_SITE_OTHER): Payer: Medicare Other | Admitting: Cardiovascular Disease

## 2021-02-10 ENCOUNTER — Ambulatory Visit (HOSPITAL_COMMUNITY): Payer: Medicare Other | Attending: Cardiology

## 2021-02-10 VITALS — BP 120/82 | HR 56 | Ht 64.0 in | Wt 164.0 lb

## 2021-02-10 DIAGNOSIS — I10 Essential (primary) hypertension: Secondary | ICD-10-CM

## 2021-02-10 DIAGNOSIS — I359 Nonrheumatic aortic valve disorder, unspecified: Secondary | ICD-10-CM | POA: Insufficient documentation

## 2021-02-10 LAB — ECHOCARDIOGRAM COMPLETE
AV Mean grad: 13 mmHg
AV Peak grad: 24.9 mmHg
Ao pk vel: 2.5 m/s
Area-P 1/2: 3.37 cm2
S' Lateral: 3.1 cm

## 2021-02-10 MED ORDER — PERFLUTREN LIPID MICROSPHERE
1.0000 mL | INTRAVENOUS | Status: AC | PRN
  Administered 2021-02-10: 1 mL via INTRAVENOUS

## 2021-02-10 NOTE — Patient Instructions (Signed)
Medication Instructions:  Your provider recommends that you continue on your current medications as directed. Please refer to the Current Medication list given to you today.   *If you need a refill on your cardiac medications before your next appointment, please call your pharmacy*   Follow-Up: You will be called to arrange your 1 year echo and office visit with Dr. Burt Knack.

## 2021-04-06 ENCOUNTER — Ambulatory Visit: Payer: Medicare Other | Admitting: Family Medicine

## 2021-04-07 ENCOUNTER — Encounter: Payer: Self-pay | Admitting: Family Medicine

## 2021-04-15 ENCOUNTER — Ambulatory Visit (INDEPENDENT_AMBULATORY_CARE_PROVIDER_SITE_OTHER): Payer: Medicare Other | Admitting: Family Medicine

## 2021-04-15 ENCOUNTER — Ambulatory Visit (INDEPENDENT_AMBULATORY_CARE_PROVIDER_SITE_OTHER): Payer: Medicare Other

## 2021-04-15 ENCOUNTER — Other Ambulatory Visit: Payer: Self-pay

## 2021-04-15 ENCOUNTER — Encounter: Payer: Self-pay | Admitting: Family Medicine

## 2021-04-15 VITALS — BP 130/81 | HR 56 | Temp 97.9°F | Ht 64.0 in | Wt 166.0 lb

## 2021-04-15 DIAGNOSIS — K219 Gastro-esophageal reflux disease without esophagitis: Secondary | ICD-10-CM | POA: Diagnosis not present

## 2021-04-15 DIAGNOSIS — E782 Mixed hyperlipidemia: Secondary | ICD-10-CM | POA: Diagnosis not present

## 2021-04-15 DIAGNOSIS — M542 Cervicalgia: Secondary | ICD-10-CM

## 2021-04-15 DIAGNOSIS — I10 Essential (primary) hypertension: Secondary | ICD-10-CM

## 2021-04-15 DIAGNOSIS — Z952 Presence of prosthetic heart valve: Secondary | ICD-10-CM | POA: Diagnosis not present

## 2021-04-15 LAB — CBC WITH DIFFERENTIAL/PLATELET
Basophils Absolute: 0.1 10*3/uL (ref 0.0–0.2)
Basos: 1 %
EOS (ABSOLUTE): 0.3 10*3/uL (ref 0.0–0.4)
Eos: 5 %
Hematocrit: 45.3 % (ref 37.5–51.0)
Hemoglobin: 15.3 g/dL (ref 13.0–17.7)
Immature Grans (Abs): 0 10*3/uL (ref 0.0–0.1)
Immature Granulocytes: 1 %
Lymphocytes Absolute: 2.2 10*3/uL (ref 0.7–3.1)
Lymphs: 41 %
MCH: 30.7 pg (ref 26.6–33.0)
MCHC: 33.8 g/dL (ref 31.5–35.7)
MCV: 91 fL (ref 79–97)
Monocytes Absolute: 0.5 10*3/uL (ref 0.1–0.9)
Monocytes: 9 %
Neutrophils Absolute: 2.3 10*3/uL (ref 1.4–7.0)
Neutrophils: 43 %
Platelets: 201 10*3/uL (ref 150–450)
RBC: 4.99 x10E6/uL (ref 4.14–5.80)
RDW: 13.2 % (ref 11.6–15.4)
WBC: 5.4 10*3/uL (ref 3.4–10.8)

## 2021-04-15 LAB — LIPID PANEL
Chol/HDL Ratio: 4.3 ratio (ref 0.0–5.0)
Cholesterol, Total: 217 mg/dL — ABNORMAL HIGH (ref 100–199)
HDL: 50 mg/dL (ref >39)
LDL Chol Calc (NIH): 132 mg/dL — ABNORMAL HIGH (ref 0–99)
Triglycerides: 195 mg/dL — ABNORMAL HIGH (ref 0–149)
VLDL Cholesterol Cal: 35 mg/dL (ref 5–40)

## 2021-04-15 LAB — CMP14+EGFR
ALT: 28 IU/L (ref 0–44)
AST: 26 IU/L (ref 0–40)
Albumin/Globulin Ratio: 2 (ref 1.2–2.2)
Albumin: 4.8 g/dL (ref 3.8–4.9)
Alkaline Phosphatase: 85 IU/L (ref 44–121)
BUN/Creatinine Ratio: 13 (ref 9–20)
BUN: 10 mg/dL (ref 6–24)
Bilirubin Total: 0.9 mg/dL (ref 0.0–1.2)
CO2: 25 mmol/L (ref 20–29)
Calcium: 9.7 mg/dL (ref 8.7–10.2)
Chloride: 105 mmol/L (ref 96–106)
Creatinine, Ser: 0.75 mg/dL — ABNORMAL LOW (ref 0.76–1.27)
Globulin, Total: 2.4 g/dL (ref 1.5–4.5)
Glucose: 108 mg/dL — ABNORMAL HIGH (ref 65–99)
Potassium: 4.3 mmol/L (ref 3.5–5.2)
Sodium: 141 mmol/L (ref 134–144)
Total Protein: 7.2 g/dL (ref 6.0–8.5)
eGFR: 104 mL/min/{1.73_m2} (ref >59)

## 2021-04-15 MED ORDER — LISINOPRIL 20 MG PO TABS: 20.0000 mg | ORAL_TABLET | Freq: Every day | ORAL | 3 refills | Status: DC

## 2021-04-15 MED ORDER — PREDNISONE 10 MG PO TABS: ORAL_TABLET | ORAL | 0 refills | Status: DC

## 2021-04-15 MED ORDER — OMEPRAZOLE 40 MG PO CPDR: 40.0000 mg | DELAYED_RELEASE_CAPSULE | Freq: Every day | ORAL | 3 refills | Status: DC

## 2021-04-15 MED ORDER — METOPROLOL SUCCINATE ER 100 MG PO TB24: ORAL_TABLET | ORAL | 3 refills | Status: DC

## 2021-04-15 MED ORDER — ROSUVASTATIN CALCIUM 5 MG PO TABS: 5.0000 mg | ORAL_TABLET | Freq: Every day | ORAL | 3 refills | Status: DC
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
## 2021-04-19 NOTE — Progress Notes (Signed)
Called patient, no answer, left message to call back.

## 2021-06-02 ENCOUNTER — Ambulatory Visit (INDEPENDENT_AMBULATORY_CARE_PROVIDER_SITE_OTHER): Payer: Medicare Other | Admitting: Family Medicine

## 2021-06-02 ENCOUNTER — Encounter: Payer: Self-pay | Admitting: Family Medicine

## 2021-06-02 ENCOUNTER — Ambulatory Visit (INDEPENDENT_AMBULATORY_CARE_PROVIDER_SITE_OTHER): Payer: Medicare Other

## 2021-06-02 ENCOUNTER — Other Ambulatory Visit: Payer: Self-pay

## 2021-06-02 VITALS — BP 140/94 | HR 64 | Temp 97.8°F | Ht 64.0 in | Wt 166.0 lb

## 2021-06-02 DIAGNOSIS — M25562 Pain in left knee: Secondary | ICD-10-CM

## 2021-06-02 DIAGNOSIS — Z23 Encounter for immunization: Secondary | ICD-10-CM

## 2021-06-02 DIAGNOSIS — M1712 Unilateral primary osteoarthritis, left knee: Secondary | ICD-10-CM | POA: Diagnosis not present

## 2021-06-02 MED ORDER — PREDNISONE 20 MG PO TABS: 40.0000 mg | ORAL_TABLET | Freq: Every day | ORAL | 0 refills | Status: AC
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
## 2021-08-13 ENCOUNTER — Other Ambulatory Visit: Payer: Self-pay | Admitting: Cardiovascular Disease

## 2021-09-15 IMAGING — DX DG CERVICAL SPINE COMPLETE 4+V
5 series · 5 of 5 positions shown · non-contrast
Comparison: 09/25/2018

CLINICAL DATA: Left-sided neck pain

EXAM:
CERVICAL SPINE - COMPLETE 4+ VIEW

[c-spine lat]
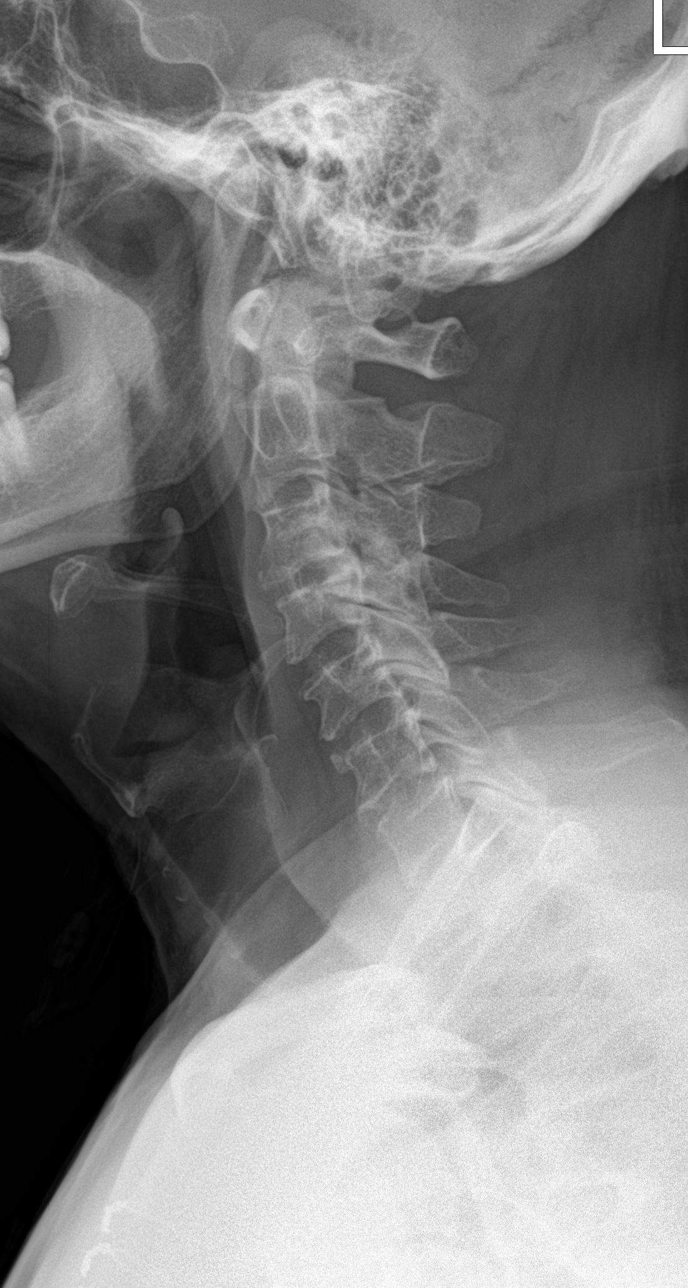

[c-spine obl (1 of 2)]
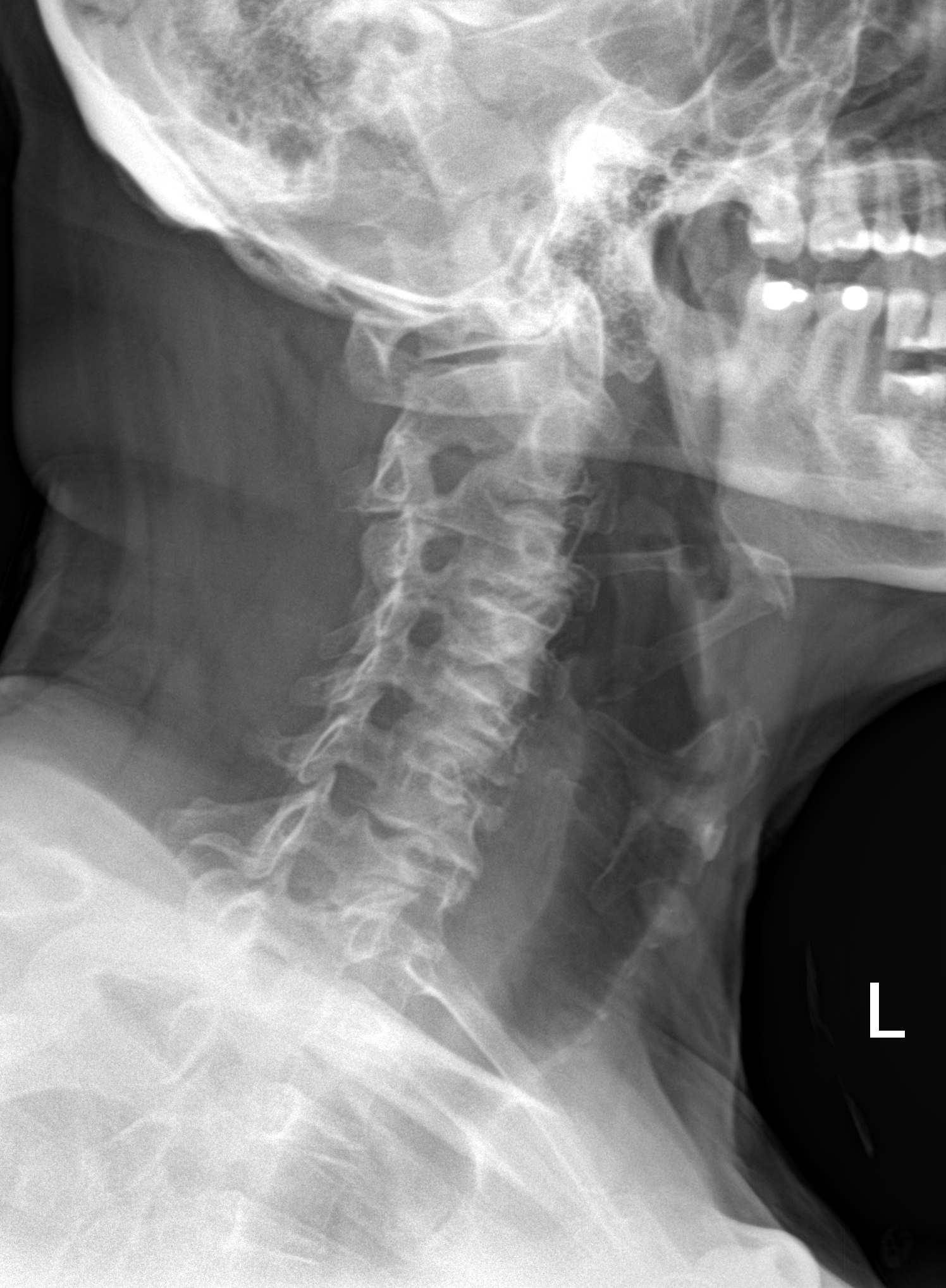

[c-spine obl (2 of 2)]
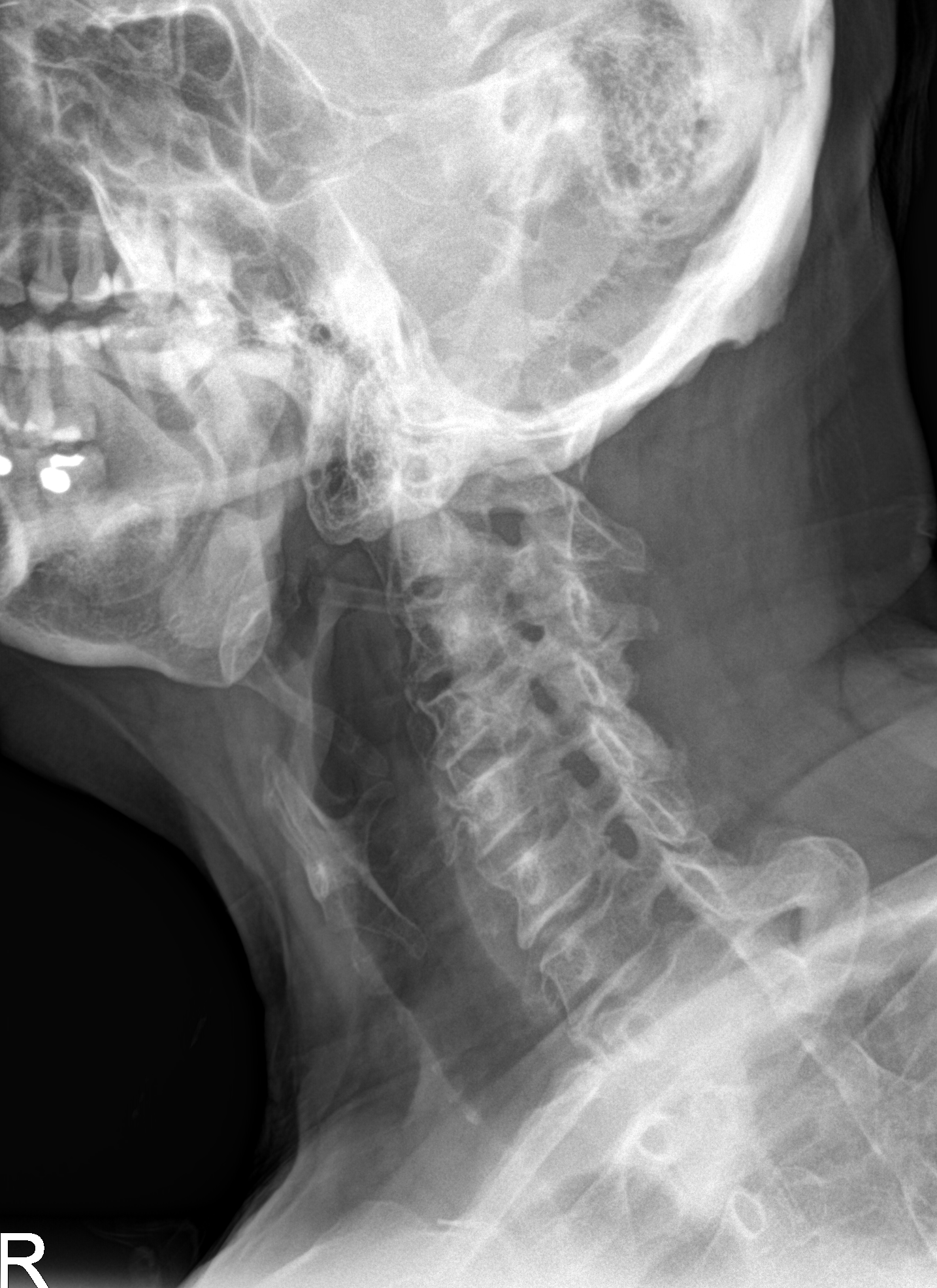

[c-spine ap]
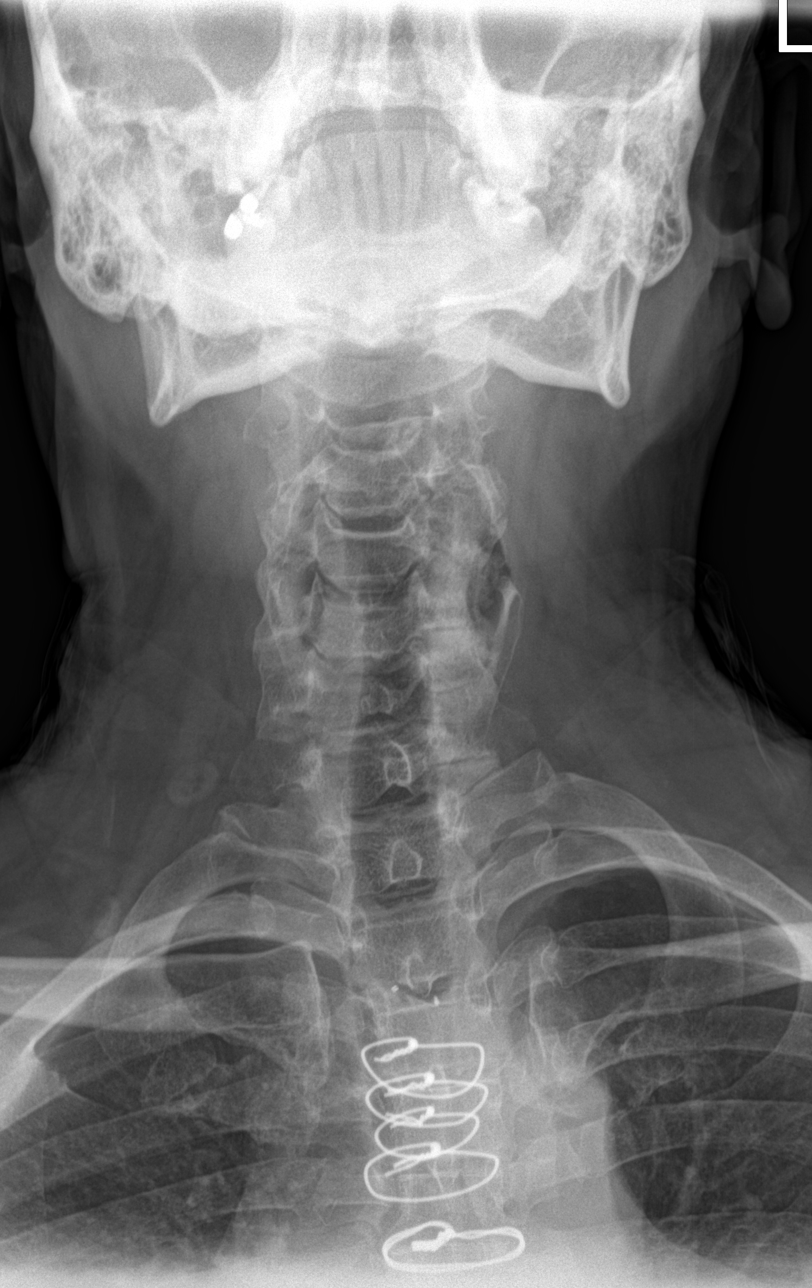

[c-spine open mouth]
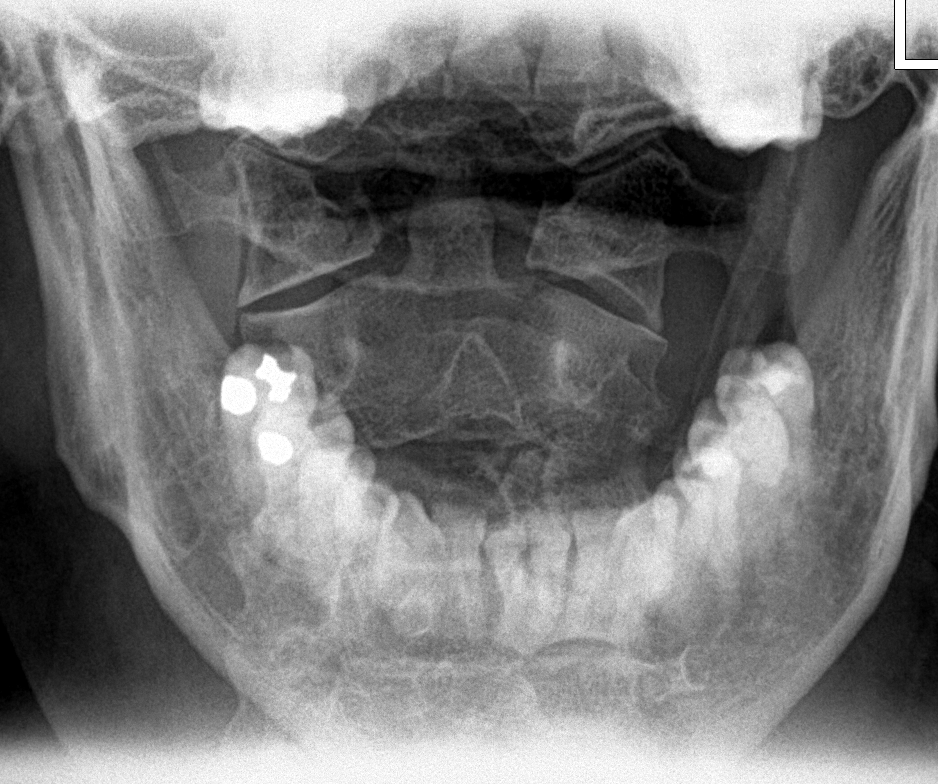

[5 of 5 positions shown; findings below may reference images not displayed]

FINDINGS: Seven cervical segments are well visualized. Vertebral body height
is well maintained. Mild osteophytic changes are noted. Facet
hypertrophic changes are seen as well. Mild neural foraminal
narrowing is noted on the left at C 3 4 and C4-5. The odontoid is
within normal limits. No soft tissue abnormality is seen.
IMPRESSION: Multilevel degenerative change as described. No acute abnormality
noted.

## 2021-09-30 ENCOUNTER — Encounter: Payer: Self-pay | Admitting: Family Medicine

## 2021-09-30 ENCOUNTER — Ambulatory Visit (INDEPENDENT_AMBULATORY_CARE_PROVIDER_SITE_OTHER): Payer: Medicare Other | Admitting: Family Medicine

## 2021-09-30 VITALS — BP 152/89 | HR 55 | Temp 97.5°F | Ht 64.0 in | Wt 162.0 lb

## 2021-09-30 DIAGNOSIS — M7918 Myalgia, other site: Secondary | ICD-10-CM | POA: Diagnosis not present

## 2021-09-30 MED ORDER — METHOCARBAMOL 500 MG PO TABS: 500.0000 mg | ORAL_TABLET | Freq: Three times a day (TID) | ORAL | 1 refills | Status: DC | PRN

## 2021-09-30 MED ORDER — PREDNISONE 10 MG (21) PO TBPK
ORAL_TABLET | ORAL | 0 refills | Status: DC
Start: 2021-09-30 — End: 2021-10-17

## 2021-09-30 NOTE — Patient Instructions (Signed)
Heating pad Muscle rub such as Biofreeze Lidoderm patches Prednisone (steroid to reduce inflammation) Robaxin (muscle relaxer)

## 2021-09-30 NOTE — Progress Notes (Signed)
Assessment & Plan:  1. Musculoskeletal pain In addition to Prednisone and Robaxin, encouraged heating pad, muscle rub such as Biofreeze, and Lidoderm patches. - predniSONE (STERAPRED UNI-PAK 21 TAB) 10 MG (21) TBPK tablet; As directed x 6 days  Dispense: 21 tablet; Refill: 0 - methocarbamol (ROBAXIN) 500 MG tablet; Take 1 tablet (500 mg total) by mouth 3 (three) times daily as needed for muscle spasms.  Dispense: 30 tablet; Refill: 1   Follow up plan: Return if symptoms worsen or fail to improve.  Hendricks Limes, MSN, APRN, FNP-C [facility]  Subjective:   Patient ID: [name], male    DOB: May 18, 1961, 61 y.o.   MRN: [id number]  HPI: [name] is a 61 y.o. male presenting on 09/30/2021 for Back Pain  Patient reports pain between his shoulder blades that started yesterday. Describes pain as pressure. He rates the pain 8/10. Worsens with movement and deep breathing. No injury or unusual activity. He took two Tylenol this morning that did not help very much. Unable to take NSAIDs due to rectal bleeding.    ROS: Negative unless specifically indicated above in HPI.   Relevant past medical history reviewed and updated as indicated.   Allergies and medications reviewed and updated.   Current Outpatient Medications:    aspirin 81 MG EC tablet, Take 81 mg by mouth daily. Swallow whole., Disp: , Rfl:    B Complex-C (SUPER B COMPLEX PO), Take 1 capsule by mouth daily., Disp: , Rfl:    clonazePAM (KLONOPIN) 0.5 MG tablet, Take 0.5 mg by mouth as needed for anxiety., Disp: , Rfl:    Coenzyme Q10 (CO Q-10) 100 MG CAPS, Take by mouth., Disp: , Rfl:    ELDERBERRY PO, Take by mouth., Disp: , Rfl:    Flaxseed, Linseed, (FLAX SEED OIL PO), Take 1 capsule by mouth daily., Disp: , Rfl:    Garlic Oil 1245 MG CAPS, Take 1 capsule by mouth daily., Disp: , Rfl:    lisinopril (ZESTRIL) 20 MG tablet, Take 1 tablet (20 mg total) by mouth daily., Disp: 90 tablet,  Rfl: 3   metoprolol succinate (TOPROL-XL) 100 MG 24 hr tablet, TAKE 1 TABLET BY MOUTH EVERY MORNING WITH OR IMMEDIATELY FOLLOWING A MEAL, Disp: 90 tablet, Rfl: 3   MILK THISTLE PO, Take 100 mg by mouth daily., Disp: , Rfl:    Multiple Vitamin (MULTIVITAMIN) tablet, Take 1 tablet by mouth daily., Disp: , Rfl:    nitroGLYCERIN (NITROSTAT) 0.4 MG SL tablet, DISSOLVE 1 TABLET UNDER THE TONGUE EVERY 5 MINUTES AS NEEDED FOR CHEST PAIN. DO NOT EXCEED A TOTAL OF 3 DOSES IN 15 MINUTES., Disp: 25 tablet, Rfl: 6   Omega-3 Fatty Acids (FISH OIL PO), Take 1 capsule by mouth 2 (two) times daily., Disp: , Rfl:    omeprazole (PRILOSEC) 40 MG capsule, Take 1 capsule (40 mg total) by mouth daily., Disp: 90 capsule, Rfl: 3   Plant Sterols and Stanols (CHOLESTOFF PLUS PO), Take by mouth daily., Disp: , Rfl:    psyllium (METAMUCIL) 58.6 % powder, Take 1 packet by mouth 2 (two) times daily., Disp: , Rfl:    RESTASIS 0.05 % ophthalmic emulsion, 1 drop 2 (two) times daily., Disp: , Rfl:    Selenium 200 MCG TABS, Take 1 tablet by mouth daily., Disp: , Rfl:   Allergies  Allergen Reactions   Ibuprofen Other (See Comments)    Rectal bleeding   Hydrocodone Nausea And Vomiting and Other (See Comments)  Sweating also   Lyrica [Pregabalin] Swelling    Fluid retention   Ambien [Zolpidem Tartrate] Other (See Comments)    Felt "wacky"   Atorvastatin Other (See Comments)    Stiff muscles   Clopidogrel Other (See Comments)    Rectal bleeding, nose bleeds   Cymbalta [Duloxetine Hcl] Other (See Comments)    Doesn't recall    Meloxicam Swelling    Tongue swelling, rectal bleeding   Nsaids Other (See Comments)    Rectal bleeding   Rosuvastatin Other (See Comments)    Stiff muscles   Tamsulosin Other (See Comments)    Constipation, weakness   Acetaminophen Rash   Citalopram Other (See Comments)    unknown   Gabapentin Itching   Statins Other (See Comments)    unknown    Objective:   BP (!) 152/89    Pulse (!)  55    Temp (!) 97.5 F (36.4 C) (Temporal)    Ht 5\' 4"  (1.626 m)    Wt 162 lb (73.5 kg)    SpO2 99%    BMI 27.81 kg/m    Physical Exam Vitals reviewed.  Constitutional:      General: He is not in acute distress.    Appearance: Normal appearance. He is not ill-appearing, toxic-appearing or diaphoretic.  HENT:     Head: Normocephalic and atraumatic.  Eyes:     General: No scleral icterus.       Right eye: No discharge.        Left eye: No discharge.     Conjunctiva/sclera: Conjunctivae normal.  Cardiovascular:     Rate and Rhythm: Normal rate.  Pulmonary:     Effort: Pulmonary effort is normal. No respiratory distress.  Musculoskeletal:        General: Normal range of motion.     Cervical back: Normal range of motion.     Comments: Trapezius muscle on right side.  Skin:    General: Skin is warm and dry.  Neurological:     Mental Status: He is alert and oriented to person, place, and time. Mental status is at baseline.  Psychiatric:        Mood and Affect: Mood normal.        Behavior: Behavior normal.        Thought Content: Thought content normal.        Judgment: Judgment normal.

## 2021-10-17 ENCOUNTER — Encounter: Payer: Self-pay | Admitting: Family Medicine

## 2021-10-17 ENCOUNTER — Ambulatory Visit (INDEPENDENT_AMBULATORY_CARE_PROVIDER_SITE_OTHER): Payer: Medicare Other | Admitting: Family Medicine

## 2021-10-17 VITALS — BP 136/83 | HR 59 | Temp 97.8°F | Ht 64.0 in | Wt 165.8 lb

## 2021-10-17 DIAGNOSIS — E559 Vitamin D deficiency, unspecified: Secondary | ICD-10-CM | POA: Diagnosis not present

## 2021-10-17 DIAGNOSIS — E782 Mixed hyperlipidemia: Secondary | ICD-10-CM

## 2021-10-17 DIAGNOSIS — I10 Essential (primary) hypertension: Secondary | ICD-10-CM

## 2021-10-17 NOTE — Patient Instructions (Signed)
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
## 2021-10-18 LAB — CMP14+EGFR
ALT: 23 IU/L (ref 0–44)
AST: 25 IU/L (ref 0–40)
Albumin/Globulin Ratio: 2.1 (ref 1.2–2.2)
Albumin: 5 g/dL — ABNORMAL HIGH (ref 3.8–4.9)
Alkaline Phosphatase: 98 IU/L (ref 44–121)
BUN/Creatinine Ratio: 13 (ref 9–20)
BUN: 11 mg/dL (ref 6–24)
Bilirubin Total: 1.1 mg/dL (ref 0.0–1.2)
CO2: 24 mmol/L (ref 20–29)
Calcium: 10.1 mg/dL (ref 8.7–10.2)
Chloride: 101 mmol/L (ref 96–106)
Creatinine, Ser: 0.84 mg/dL (ref 0.76–1.27)
Globulin, Total: 2.4 g/dL (ref 1.5–4.5)
Glucose: 109 mg/dL — ABNORMAL HIGH (ref 70–99)
Potassium: 4.3 mmol/L (ref 3.5–5.2)
Sodium: 143 mmol/L (ref 134–144)
Total Protein: 7.4 g/dL (ref 6.0–8.5)
eGFR: 100 mL/min/{1.73_m2} (ref >59)

## 2021-10-18 LAB — CBC WITH DIFFERENTIAL/PLATELET
Basophils Absolute: 0.1 10*3/uL (ref 0.0–0.2)
Basos: 1 %
EOS (ABSOLUTE): 0.3 10*3/uL (ref 0.0–0.4)
Eos: 5 %
Hematocrit: 44.6 % (ref 37.5–51.0)
Hemoglobin: 15.4 g/dL (ref 13.0–17.7)
Immature Grans (Abs): 0 10*3/uL (ref 0.0–0.1)
Immature Granulocytes: 0 %
Lymphocytes Absolute: 2.2 10*3/uL (ref 0.7–3.1)
Lymphs: 40 %
MCH: 31 pg (ref 26.6–33.0)
MCHC: 34.5 g/dL (ref 31.5–35.7)
MCV: 90 fL (ref 79–97)
Monocytes Absolute: 0.5 10*3/uL (ref 0.1–0.9)
Monocytes: 10 %
Neutrophils Absolute: 2.5 10*3/uL (ref 1.4–7.0)
Neutrophils: 44 %
Platelets: 249 10*3/uL (ref 150–450)
RBC: 4.96 x10E6/uL (ref 4.14–5.80)
RDW: 12.7 % (ref 11.6–15.4)
WBC: 5.6 10*3/uL (ref 3.4–10.8)

## 2021-10-18 LAB — LIPID PANEL
Chol/HDL Ratio: 4.6 ratio (ref 0.0–5.0)
Cholesterol, Total: 242 mg/dL — ABNORMAL HIGH (ref 100–199)
HDL: 53 mg/dL (ref >39)
LDL Chol Calc (NIH): 155 mg/dL — ABNORMAL HIGH (ref 0–99)
Triglycerides: 186 mg/dL — ABNORMAL HIGH (ref 0–149)
VLDL Cholesterol Cal: 34 mg/dL (ref 5–40)

## 2021-10-18 LAB — VITAMIN D 25 HYDROXY (VIT D DEFICIENCY, FRACTURES): Vit D, 25-Hydroxy: 36 ng/mL (ref 30.0–100.0)

## 2021-10-26 ENCOUNTER — Telehealth: Payer: Self-pay

## 2021-10-26 DIAGNOSIS — I359 Nonrheumatic aortic valve disorder, unspecified: Secondary | ICD-10-CM

## 2021-10-26 NOTE — Telephone Encounter (Signed)
Per Dr. Burt Knack, called to arrange 1 year echo and office visit (due 02/10/2022).  Left message to call back.

## 2021-10-27 ENCOUNTER — Encounter: Payer: Self-pay | Admitting: Cardiovascular Disease

## 2021-10-27 NOTE — Telephone Encounter (Signed)
Scheduled the patient for echo/same day visit with Dr. Burt Knack 02/09/2022. DPR (Tammy) was grateful for call and agrees with plan.

## 2021-10-27 NOTE — Telephone Encounter (Signed)
This encounter was created in error - please disregard.

## 2021-10-27 NOTE — Telephone Encounter (Signed)
Follow Up:     Patient's wife is retuning Katie's call.

## 2021-10-27 NOTE — Telephone Encounter (Signed)
Left message to call back  

## 2021-10-27 NOTE — Addendum Note (Signed)
Addended by: Harland German A on: 10/27/2021 12:54 PM   Modules accepted: Orders

## 2021-10-31 ENCOUNTER — Other Ambulatory Visit: Payer: Self-pay | Admitting: Family Medicine

## 2021-10-31 MED ORDER — EZETIMIBE 10 MG PO TABS: 10.0000 mg | ORAL_TABLET | Freq: Every day | ORAL | 3 refills | Status: DC

## 2021-10-31 NOTE — Progress Notes (Signed)
Zetia prescribed

## 2021-11-23 DIAGNOSIS — H10013 Acute follicular conjunctivitis, bilateral: Secondary | ICD-10-CM | POA: Diagnosis not present

## 2022-02-09 ENCOUNTER — Encounter: Payer: Self-pay | Admitting: Cardiovascular Disease

## 2022-02-09 ENCOUNTER — Ambulatory Visit (HOSPITAL_COMMUNITY): Payer: Medicare Other | Attending: Cardiology

## 2022-02-09 ENCOUNTER — Ambulatory Visit (INDEPENDENT_AMBULATORY_CARE_PROVIDER_SITE_OTHER): Payer: Medicare Other | Admitting: Cardiovascular Disease

## 2022-02-09 VITALS — BP 118/80 | HR 56 | Ht 64.0 in | Wt 160.4 lb

## 2022-02-09 DIAGNOSIS — I10 Essential (primary) hypertension: Secondary | ICD-10-CM | POA: Diagnosis not present

## 2022-02-09 DIAGNOSIS — I359 Nonrheumatic aortic valve disorder, unspecified: Secondary | ICD-10-CM | POA: Diagnosis not present

## 2022-02-09 LAB — ECHOCARDIOGRAM COMPLETE
AR max vel: 2.16 cm2
AV Area VTI: 2.22 cm2
AV Area mean vel: 2.14 cm2
AV Mean grad: 10 mmHg
AV Peak grad: 19.1 mmHg
Ao pk vel: 2.19 m/s
Area-P 1/2: 3.37 cm2
S' Lateral: 2.3 cm
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
## 2022-02-09 NOTE — Patient Instructions (Signed)
Medication Instructions:  Your physician recommends that you continue on your current medications as directed. Please refer to the Current Medication list given to you today.  *If you need a refill on your cardiac medications before your next appointment, please call your pharmacy*   Lab Work: NONE If you have labs (blood work) drawn today and your tests are completely normal, you will receive your results only by: [place] (if you have MyChart) OR A paper copy in the mail If you have any lab test that is abnormal or we need to change your treatment, we will call you to review the results.   Testing/Procedures: ECHO (in 1 year with same day appt) Your physician has requested that you have an echocardiogram. Echocardiography is a painless test that uses sound waves to create images of your heart. It provides your doctor with information about the size and shape of your heart and how well your heart's chambers and valves are working. This procedure takes approximately one hour. There are no restrictions for this procedure.  Follow-Up: At [facility], you and your health needs are our priority.  As part of our continuing mission to provide you with exceptional heart care, we have created designated Provider Care Teams.  These Care Teams include your primary Cardiologist (physician) and Advanced Practice Providers (APPs -  Physician Assistants and Nurse Practitioners) who all work together to provide you with the care you need, when you need it.  Your next appointment:   1 year(s)  The format for your next appointment:   In Person  Provider:   Sherren Mocha, MD      Important Information About Sugar

## 2022-03-28 DIAGNOSIS — H5213 Myopia, bilateral: Secondary | ICD-10-CM | POA: Diagnosis not present

## 2022-03-30 ENCOUNTER — Other Ambulatory Visit: Payer: Self-pay | Admitting: Family Medicine

## 2022-03-30 DIAGNOSIS — I10 Essential (primary) hypertension: Secondary | ICD-10-CM

## 2022-03-30 DIAGNOSIS — K219 Gastro-esophageal reflux disease without esophagitis: Secondary | ICD-10-CM

## 2022-04-03 ENCOUNTER — Other Ambulatory Visit: Payer: Self-pay | Admitting: Family Medicine

## 2022-04-03 DIAGNOSIS — I10 Essential (primary) hypertension: Secondary | ICD-10-CM

## 2022-04-27 ENCOUNTER — Ambulatory Visit: Payer: Medicare Other | Admitting: Family Medicine

## 2022-05-24 ENCOUNTER — Ambulatory Visit (INDEPENDENT_AMBULATORY_CARE_PROVIDER_SITE_OTHER): Payer: Medicare Other | Admitting: Family Medicine

## 2022-05-24 ENCOUNTER — Encounter: Payer: Self-pay | Admitting: Family Medicine

## 2022-05-24 VITALS — BP 125/77 | HR 60 | Temp 97.6°F | Ht 64.0 in | Wt 162.2 lb

## 2022-05-24 DIAGNOSIS — Z789 Other specified health status: Secondary | ICD-10-CM | POA: Diagnosis not present

## 2022-05-24 DIAGNOSIS — I10 Essential (primary) hypertension: Secondary | ICD-10-CM

## 2022-05-24 DIAGNOSIS — Z0001 Encounter for general adult medical examination with abnormal findings: Secondary | ICD-10-CM

## 2022-05-24 DIAGNOSIS — E782 Mixed hyperlipidemia: Secondary | ICD-10-CM | POA: Diagnosis not present

## 2022-05-24 DIAGNOSIS — E559 Vitamin D deficiency, unspecified: Secondary | ICD-10-CM | POA: Diagnosis not present

## 2022-05-24 DIAGNOSIS — Z0000 Encounter for general adult medical examination without abnormal findings: Secondary | ICD-10-CM

## 2022-05-24 DIAGNOSIS — Z125 Encounter for screening for malignant neoplasm of prostate: Secondary | ICD-10-CM

## 2022-05-24 DIAGNOSIS — K219 Gastro-esophageal reflux disease without esophagitis: Secondary | ICD-10-CM

## 2022-05-24 MED ORDER — OMEPRAZOLE 40 MG PO CPDR: 40.0000 mg | DELAYED_RELEASE_CAPSULE | Freq: Every day | ORAL | 3 refills | Status: DC

## 2022-05-24 MED ORDER — METOPROLOL SUCCINATE ER 100 MG PO TB24: ORAL_TABLET | ORAL | 3 refills | Status: DC

## 2022-05-24 NOTE — Progress Notes (Addendum)
Complete physical exam  Patient: [name]   DOB: 01/05/1961   61 y.o. Male  MRN: [id number]  Subjective:    Chief Complaint  Patient presents with   Annual Exam    [name] is a 61 y.o. male who presents today for a complete physical exam. He reports consuming a general diet. Home exercise routine includes walking 1 hrs per week. He generally feels well. He reports sleeping fairly well. He does not have additional problems to discuss today.    Most recent fall risk assessment:    05/24/2022   12:59 PM  [facility] in the past year? 0     Most recent depression screenings:    05/24/2022   12:59 PM 10/17/2021    8:02 AM  PHQ 2/9 Scores  PHQ - 2 Score 0 0        Patient Care Team: Claretta Fraise, MD as PCP - General (Family Medicine) Sherren Mocha, MD as PCP - Cardiology (Cardiology)   Outpatient Medications Prior to Visit  Medication Sig   amoxicillin (AMOXIL) 500 MG capsule Take 500 mg by mouth. 4 capsules by mouth one hour prior to dental procedure   aspirin 81 MG EC tablet Take 81 mg by mouth daily. Swallow whole.   B Complex-C (SUPER B COMPLEX PO) Take 1 capsule by mouth daily.   Coenzyme Q10 (CO Q-10) 100 MG CAPS Take by mouth.   ELDERBERRY PO Take by mouth.   ezetimibe (ZETIA) 10 MG tablet Take 1 tablet (10 mg total) by mouth daily. For cholesterol   Flaxseed, Linseed, (FLAX SEED OIL PO) Take 1 capsule by mouth daily.   Garlic Oil 8032 MG CAPS Take 1 capsule by mouth daily.   lisinopril (ZESTRIL) 20 MG tablet TAKE 1 TABLET BY MOUTH DAILY   MILK THISTLE PO Take 100 mg by mouth daily.   Multiple Vitamin (MULTIVITAMIN) tablet Take 1 tablet by mouth daily.   nitroGLYCERIN (NITROSTAT) 0.4 MG SL tablet DISSOLVE 1 TABLET UNDER THE TONGUE EVERY 5 MINUTES AS NEEDED FOR CHEST PAIN. DO NOT EXCEED A TOTAL OF 3 DOSES IN 15 MINUTES.   Omega-3 Fatty Acids (FISH OIL PO) Take 1 capsule by mouth 2 (two) times daily.   Plant Sterols and Stanols  (CHOLESTOFF PLUS PO) Take by mouth daily.   psyllium (METAMUCIL) 58.6 % powder Take 1 packet by mouth 2 (two) times daily.   RESTASIS 0.05 % ophthalmic emulsion 1 drop 2 (two) times daily.   [DISCONTINUED] metoprolol succinate (TOPROL-XL) 100 MG 24 hr tablet TAKE 1 TABLET BY MOUTH EVERY MORNING WITH OR IMMEDIATELY FOLLOWING A MEAL  (NEEDS TO BE SEEN BEFORE NEXT REFILL)   [DISCONTINUED] omeprazole (PRILOSEC) 40 MG capsule Take 1 capsule (40 mg total) by mouth daily. (NEEDS TO BE SEEN BEFORE NEXT REFILL)   No facility-administered medications prior to visit.    Review of Systems  Constitutional:  Negative for chills, diaphoresis, fever, malaise/fatigue and weight loss.  HENT:  Negative for congestion, ear pain, hearing loss, nosebleeds, sore throat and tinnitus.   Eyes: Negative.  Negative for blurred vision, double vision, photophobia, pain, discharge and redness.  Respiratory:  Negative for cough, hemoptysis, sputum production, shortness of breath and wheezing.   Cardiovascular:  Negative for chest pain, palpitations, orthopnea, leg swelling and PND.  Gastrointestinal:  Negative for abdominal pain, blood in stool, constipation, diarrhea, heartburn, melena, nausea and vomiting.  Genitourinary:  Negative for dysuria, flank pain, frequency, hematuria and urgency.  Musculoskeletal:  Negative for back pain, falls, joint pain, myalgias and neck pain.  Skin:  Negative for itching and rash.  Neurological:  Negative for dizziness, tingling, tremors, sensory change, speech change, focal weakness, seizures, loss of consciousness, weakness and headaches.  Endo/Heme/Allergies:  Negative for environmental allergies and polydipsia. Does not bruise/bleed easily.  Psychiatric/Behavioral: Negative.  Negative for depression, hallucinations, memory loss, substance abuse and suicidal ideas. The patient is not nervous/anxious and does not have insomnia.           Objective:     BP 125/77   Pulse 60   Temp  97.6 F (36.4 C)   Ht '5\' 4"'  (1.626 m)   Wt 162 lb 3.2 oz (73.6 kg)   SpO2 96%   BMI 27.84 kg/m  BP Readings from Last 3 Encounters:  05/24/22 125/77  02/09/22 118/80  10/17/21 136/83   Wt Readings from Last 3 Encounters:  05/24/22 162 lb 3.2 oz (73.6 kg)  02/09/22 160 lb 6.4 oz (72.8 kg)  10/17/21 165 lb 12.8 oz (75.2 kg)   SpO2 Readings from Last 3 Encounters:  05/24/22 96%  02/09/22 97%  10/17/21 100%      Physical Exam Constitutional:      Appearance: He is well-developed.  HENT:     Head: Normocephalic and atraumatic.  Eyes:     Pupils: Pupils are equal, round, and reactive to light.  Neck:     Thyroid: No thyromegaly.     Trachea: No tracheal deviation.  Cardiovascular:     Rate and Rhythm: Normal rate and regular rhythm.     Heart sounds: Normal heart sounds. No murmur heard.    No friction rub. No gallop.  Pulmonary:     Breath sounds: Normal breath sounds. No wheezing or rales.  Abdominal:     General: Bowel sounds are normal. There is no distension.     Palpations: Abdomen is soft. There is no mass.     Tenderness: There is no abdominal tenderness.     Hernia: There is no hernia in the left inguinal area.  Genitourinary:    Penis: Normal.      Testes: Normal.  Musculoskeletal:        General: Normal range of motion.     Cervical back: Normal range of motion.  Lymphadenopathy:     Cervical: No cervical adenopathy.  Skin:    General: Skin is warm and dry.  Neurological:     Mental Status: He is alert and oriented to person, place, and time.      Results for orders placed or performed in visit on 05/24/22  CBC with Differential/Platelet  Result Value Ref Range   WBC 6.1 3.4 - 10.8 x10E3/uL   RBC 4.80 4.14 - 5.80 x10E6/uL   Hemoglobin 15.1 13.0 - 17.7 g/dL   Hematocrit 44.0 37.5 - 51.0 %   MCV 92 79 - 97 fL   MCH 31.5 26.6 - 33.0 pg   MCHC 34.3 31.5 - 35.7 g/dL   RDW 13.4 11.6 - 15.4 %   Platelets 226 150 - 450 x10E3/uL   Neutrophils 51 Not  Estab. %   Lymphs 34 Not Estab. %   Monocytes 10 Not Estab. %   Eos 3 Not Estab. %   Basos 1 Not Estab. %   Neutrophils Absolute 3.1 1.4 - 7.0 x10E3/uL   Lymphocytes Absolute 2.1 0.7 - 3.1 x10E3/uL   Monocytes Absolute 0.6 0.1 - 0.9 x10E3/uL   EOS (ABSOLUTE) 0.2 0.0 -  0.4 x10E3/uL   Basophils Absolute 0.0 0.0 - 0.2 x10E3/uL   Immature Granulocytes 1 Not Estab. %   Immature Grans (Abs) 0.0 0.0 - 0.1 x10E3/uL  CMP14+EGFR  Result Value Ref Range   Glucose 101 (H) 70 - 99 mg/dL   BUN 14 8 - 27 mg/dL   Creatinine, Ser 0.75 (L) 0.76 - 1.27 mg/dL   eGFR 103 >59 mL/min/1.73   BUN/Creatinine Ratio 19 10 - 24   Sodium 142 134 - 144 mmol/L   Potassium 4.5 3.5 - 5.2 mmol/L   Chloride 105 96 - 106 mmol/L   CO2 21 20 - 29 mmol/L   Calcium 10.1 8.6 - 10.2 mg/dL   Total Protein 7.2 6.0 - 8.5 g/dL   Albumin 4.9 3.8 - 4.9 g/dL   Globulin, Total 2.3 1.5 - 4.5 g/dL   Albumin/Globulin Ratio 2.1 1.2 - 2.2   Bilirubin Total 1.5 (H) 0.0 - 1.2 mg/dL   Alkaline Phosphatase 94 44 - 121 IU/L   AST 28 0 - 40 IU/L   ALT 25 0 - 44 IU/L  Lipid panel  Result Value Ref Range   Cholesterol, Total 235 (H) 100 - 199 mg/dL   Triglycerides 235 (H) 0 - 149 mg/dL   HDL 53 >39 mg/dL   VLDL Cholesterol Cal 42 (H) 5 - 40 mg/dL   LDL Chol Calc (NIH) 140 (H) 0 - 99 mg/dL   Chol/HDL Ratio 4.4 0.0 - 5.0 ratio  VITAMIN D 25 Hydroxy (Vit-D Deficiency, Fractures)  Result Value Ref Range   Vit D, 25-Hydroxy 37.3 30.0 - 100.0 ng/mL  PSA, total and free  Result Value Ref Range   Prostate Specific Ag, Serum 1.0 0.0 - 4.0 ng/mL   PSA, Free 0.30 N/A ng/mL   PSA, Free Pct 78.2 %   Last metabolic panel Lab Results  Component Value Date   GLUCOSE 101 (H) 05/24/2022   NA 142 05/24/2022   K 4.5 05/24/2022   CL 105 05/24/2022   CO2 21 05/24/2022   BUN 14 05/24/2022   CREATININE 0.75 (L) 05/24/2022   EGFR 103 05/24/2022   CALCIUM 10.1 05/24/2022   PROT 7.2 05/24/2022   ALBUMIN 4.9 05/24/2022   LABGLOB 2.3 05/24/2022    AGRATIO 2.1 05/24/2022   BILITOT 1.5 (H) 05/24/2022   ALKPHOS 94 05/24/2022   AST 28 05/24/2022   ALT 25 05/24/2022   ANIONGAP 7 11/04/2019   Last lipids Lab Results  Component Value Date   CHOL 235 (H) 05/24/2022   HDL 53 05/24/2022   LDLCALC 140 (H) 05/24/2022   TRIG 235 (H) 05/24/2022   CHOLHDL 4.4 05/24/2022        Assessment & Plan:    Routine Health Maintenance and Physical Exam  Immunization History  Administered Date(s) Administered   Influenza,inj,Quad PF,6+ Mos 06/21/2015, 07/03/2017   Influenza,inj,quad, With Preservative 06/13/2018   Influenza-Unspecified 06/25/2014   MMR 09/20/1999   Td 09/20/1999, 11/05/2009   Tdap 06/02/2021    Health Maintenance  Topic Date Due   COVID-19 Vaccine (1) 06/09/2022 (Originally 07/23/1961)   Zoster Vaccines- Shingrix (1 of 2) 08/23/2022 (Originally 01/21/2012)   INFLUENZA VACCINE  12/10/2022 (Originally 04/11/2022)   Hepatitis C Screening  05/25/2023 (Originally 01/21/1980)   HIV Screening  05/25/2023 (Originally 01/20/1977)   COLONOSCOPY (Pts 45-9yr Insurance coverage will need to be confirmed)  10/16/2024   TETANUS/TDAP  06/03/2031   HPV VACCINES  Aged Out    Discussed health benefits of physical activity, and encouraged him to  engage in regular exercise appropriate for his age and condition.  Problem List Items Addressed This Visit       Active Problems   GERD   Relevant Medications   omeprazole (PRILOSEC) 40 MG capsule   HYPERTENSION, BENIGN   Relevant Medications   metoprolol succinate (TOPROL-XL) 100 MG 24 hr tablet   Mixed hyperlipidemia   Relevant Medications   metoprolol succinate (TOPROL-XL) 100 MG 24 hr tablet   Other Relevant Orders   Lipid panel (Completed)   Uncontrolled hypertension - Primary   Relevant Medications   metoprolol succinate (TOPROL-XL) 100 MG 24 hr tablet   Other Relevant Orders   CBC with Differential/Platelet (Completed)   CMP14+EGFR (Completed)   Other Visit Diagnoses      Vitamin D deficiency       Relevant Orders   VITAMIN D 25 Hydroxy (Vit-D Deficiency, Fractures) (Completed)   Screening for prostate cancer       Relevant Orders   PSA, total and free (Completed)   Well adult exam       Relevant Orders   CBC with Differential/Platelet (Completed)   CMP14+EGFR (Completed)   Lipid panel (Completed)   VITAMIN D 25 Hydroxy (Vit-D Deficiency, Fractures) (Completed)   PSA, total and free (Completed)   Statin intolerance          Return in about 6 months (around 11/22/2022).     Claretta Fraise, MD

## 2022-05-25 LAB — CMP14+EGFR
ALT: 25 IU/L (ref 0–44)
AST: 28 IU/L (ref 0–40)
Albumin/Globulin Ratio: 2.1 (ref 1.2–2.2)
Albumin: 4.9 g/dL (ref 3.8–4.9)
Alkaline Phosphatase: 94 IU/L (ref 44–121)
BUN/Creatinine Ratio: 19 (ref 10–24)
BUN: 14 mg/dL (ref 8–27)
Bilirubin Total: 1.5 mg/dL — ABNORMAL HIGH (ref 0.0–1.2)
CO2: 21 mmol/L (ref 20–29)
Calcium: 10.1 mg/dL (ref 8.6–10.2)
Chloride: 105 mmol/L (ref 96–106)
Creatinine, Ser: 0.75 mg/dL — ABNORMAL LOW (ref 0.76–1.27)
Globulin, Total: 2.3 g/dL (ref 1.5–4.5)
Glucose: 101 mg/dL — ABNORMAL HIGH (ref 70–99)
Potassium: 4.5 mmol/L (ref 3.5–5.2)
Sodium: 142 mmol/L (ref 134–144)
Total Protein: 7.2 g/dL (ref 6.0–8.5)
eGFR: 103 mL/min/{1.73_m2} (ref >59)

## 2022-05-25 LAB — CBC WITH DIFFERENTIAL/PLATELET
Basophils Absolute: 0 10*3/uL (ref 0.0–0.2)
Basos: 1 %
EOS (ABSOLUTE): 0.2 10*3/uL (ref 0.0–0.4)
Eos: 3 %
Hematocrit: 44 % (ref 37.5–51.0)
Hemoglobin: 15.1 g/dL (ref 13.0–17.7)
Immature Grans (Abs): 0 10*3/uL (ref 0.0–0.1)
Immature Granulocytes: 1 %
Lymphocytes Absolute: 2.1 10*3/uL (ref 0.7–3.1)
Lymphs: 34 %
MCH: 31.5 pg (ref 26.6–33.0)
MCHC: 34.3 g/dL (ref 31.5–35.7)
MCV: 92 fL (ref 79–97)
Monocytes Absolute: 0.6 10*3/uL (ref 0.1–0.9)
Monocytes: 10 %
Neutrophils Absolute: 3.1 10*3/uL (ref 1.4–7.0)
Neutrophils: 51 %
Platelets: 226 10*3/uL (ref 150–450)
RBC: 4.8 x10E6/uL (ref 4.14–5.80)
RDW: 13.4 % (ref 11.6–15.4)
WBC: 6.1 10*3/uL (ref 3.4–10.8)

## 2022-05-25 LAB — LIPID PANEL
Chol/HDL Ratio: 4.4 ratio (ref 0.0–5.0)
Cholesterol, Total: 235 mg/dL — ABNORMAL HIGH (ref 100–199)
HDL: 53 mg/dL (ref >39)
LDL Chol Calc (NIH): 140 mg/dL — ABNORMAL HIGH (ref 0–99)
Triglycerides: 235 mg/dL — ABNORMAL HIGH (ref 0–149)
VLDL Cholesterol Cal: 42 mg/dL — ABNORMAL HIGH (ref 5–40)

## 2022-05-25 LAB — PSA, TOTAL AND FREE
PSA, Free Pct: 30 %
PSA, Free: 0.3 ng/mL
Prostate Specific Ag, Serum: 1 ng/mL (ref 0.0–4.0)

## 2022-05-25 LAB — VITAMIN D 25 HYDROXY (VIT D DEFICIENCY, FRACTURES): Vit D, 25-Hydroxy: 37.3 ng/mL (ref 30.0–100.0)

## 2022-08-17 ENCOUNTER — Ambulatory Visit (HOSPITAL_COMMUNITY): Payer: Medicare Other

## 2022-08-26 ENCOUNTER — Other Ambulatory Visit: Payer: Self-pay | Admitting: Cardiovascular Disease

## 2022-10-24 ENCOUNTER — Encounter: Payer: Self-pay | Admitting: Family Medicine

## 2022-10-24 ENCOUNTER — Ambulatory Visit (HOSPITAL_COMMUNITY)
Admission: RE | Admit: 2022-10-24 | Discharge: 2022-10-24 | Disposition: A | Discharge status: Home / Self Care | Payer: 59 | Source: Ambulatory Visit | Attending: Family Medicine | Admitting: Family Medicine

## 2022-10-24 ENCOUNTER — Ambulatory Visit (INDEPENDENT_AMBULATORY_CARE_PROVIDER_SITE_OTHER): Payer: 59 | Admitting: Family Medicine

## 2022-10-24 VITALS — BP 138/81 | HR 55 | Temp 97.1°F | Ht 64.0 in | Wt 167.2 lb

## 2022-10-24 DIAGNOSIS — M25512 Pain in left shoulder: Secondary | ICD-10-CM | POA: Diagnosis not present

## 2022-10-24 DIAGNOSIS — R202 Paresthesia of skin: Secondary | ICD-10-CM

## 2022-10-24 DIAGNOSIS — M542 Cervicalgia: Secondary | ICD-10-CM | POA: Diagnosis not present

## 2022-10-24 MED ORDER — PREDNISONE 10 MG PO TABS: ORAL_TABLET | ORAL | 0 refills | Status: DC
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
## 2022-10-25 ENCOUNTER — Encounter: Payer: Self-pay | Admitting: Family Medicine

## 2023-02-09 ENCOUNTER — Ambulatory Visit (HOSPITAL_COMMUNITY): Payer: 59 | Attending: Cardiovascular Disease

## 2023-02-09 DIAGNOSIS — I359 Nonrheumatic aortic valve disorder, unspecified: Secondary | ICD-10-CM | POA: Diagnosis not present

## 2023-02-09 LAB — ECHOCARDIOGRAM COMPLETE
AR max vel: 1.48 cm2
AV Area VTI: 1.6 cm2
AV Area mean vel: 1.48 cm2
AV Mean grad: 15.3 mmHg
AV Peak grad: 29.2 mmHg
Ao pk vel: 2.7 m/s
Area-P 1/2: 3.77 cm2
S' Lateral: 2.4 cm

## 2023-02-12 ENCOUNTER — Ambulatory Visit: Payer: 59 | Attending: Cardiovascular Disease | Admitting: Cardiovascular Disease

## 2023-02-12 ENCOUNTER — Encounter: Payer: Self-pay | Admitting: Cardiovascular Disease

## 2023-02-12 VITALS — BP 138/80 | HR 51 | Ht 64.0 in | Wt 158.4 lb

## 2023-02-12 DIAGNOSIS — I359 Nonrheumatic aortic valve disorder, unspecified: Secondary | ICD-10-CM | POA: Diagnosis not present

## 2023-02-12 DIAGNOSIS — I10 Essential (primary) hypertension: Secondary | ICD-10-CM | POA: Diagnosis not present

## 2023-02-12 NOTE — Patient Instructions (Signed)
Medication Instructions:  Your physician recommends that you continue on your current medications as directed. Please refer to the Current Medication list given to you today.  *If you need a refill on your cardiac medications before your next appointment, please call your pharmacy* Lab Work: NONE If you have labs (blood work) drawn today and your tests are completely normal, you will receive your results only by: MyChart Message (if you have MyChart) OR A paper copy in the mail If you have any lab test that is abnormal or we need to change your treatment, we will call you to review the results.  Testing/Procedures: ECHO (in 1 year) Your physician has requested that you have an echocardiogram. Echocardiography is a painless test that uses sound waves to create images of your heart. It provides your doctor with information about the size and shape of your heart and how well your heart's chambers and valves are working. This procedure takes approximately one hour. There are no restrictions for this procedure. Please do NOT wear cologne, perfume, aftershave, or lotions (deodorant is allowed). Please arrive 15 minutes prior to your appointment time.  Follow-Up: At [facility], you and your health needs are our priority.  As part of our continuing mission to provide you with exceptional heart care, we have created designated Provider Care Teams.  These Care Teams include your primary Cardiologist (physician) and Advanced Practice Providers (APPs -  Physician Assistants and Nurse Practitioners) who all work together to provide you with the care you need, when you need it  Your next appointment:   1 year(s)  Provider:   Tonny Bollman, MD
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
## 2023-03-13 ENCOUNTER — Encounter: Payer: Self-pay | Admitting: Family Medicine

## 2023-03-13 ENCOUNTER — Ambulatory Visit: Payer: 59 | Admitting: Family Medicine

## 2023-03-13 VITALS — BP 131/78 | HR 59 | Temp 97.9°F | Ht 64.0 in | Wt 161.8 lb

## 2023-03-13 DIAGNOSIS — M7651 Patellar tendinitis, right knee: Secondary | ICD-10-CM | POA: Diagnosis not present

## 2023-03-13 DIAGNOSIS — M76891 Other specified enthesopathies of right lower limb, excluding foot: Secondary | ICD-10-CM

## 2023-03-13 MED ORDER — BETAMETHASONE SOD PHOS & ACET 6 (3-3) MG/ML IJ SUSP
6.0000 mg | Freq: Once | INTRAMUSCULAR | Status: AC
Start: 2023-03-13 — End: 2023-03-13
  Administered 2023-03-13: 6 mg via INTRAMUSCULAR

## 2023-03-13 MED ORDER — DICLOFENAC SODIUM 75 MG PO TBEC
75.0000 mg | DELAYED_RELEASE_TABLET | Freq: Two times a day (BID) | ORAL | 2 refills | Status: DC
Start: 2023-03-13 — End: 2023-08-02
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
## 2023-04-08 ENCOUNTER — Other Ambulatory Visit: Payer: Self-pay | Admitting: Family Medicine

## 2023-04-08 DIAGNOSIS — I10 Essential (primary) hypertension: Secondary | ICD-10-CM

## 2023-07-02 ENCOUNTER — Other Ambulatory Visit: Payer: Self-pay | Admitting: Family Medicine

## 2023-07-02 DIAGNOSIS — I10 Essential (primary) hypertension: Secondary | ICD-10-CM

## 2023-07-11 ENCOUNTER — Other Ambulatory Visit: Payer: Self-pay | Admitting: Family Medicine

## 2023-07-11 DIAGNOSIS — I10 Essential (primary) hypertension: Secondary | ICD-10-CM

## 2023-07-22 ENCOUNTER — Other Ambulatory Visit: Payer: Self-pay | Admitting: Family Medicine

## 2023-07-22 DIAGNOSIS — I10 Essential (primary) hypertension: Secondary | ICD-10-CM

## 2023-08-02 ENCOUNTER — Other Ambulatory Visit: Payer: Self-pay | Admitting: Cardiovascular Disease

## 2023-08-02 ENCOUNTER — Encounter: Payer: Self-pay | Admitting: Family Medicine

## 2023-08-02 ENCOUNTER — Ambulatory Visit: Payer: 59 | Admitting: Family Medicine

## 2023-08-02 VITALS — BP 126/75 | HR 49 | Temp 97.8°F | Ht 64.0 in | Wt 157.2 lb

## 2023-08-02 DIAGNOSIS — E782 Mixed hyperlipidemia: Secondary | ICD-10-CM

## 2023-08-02 DIAGNOSIS — E559 Vitamin D deficiency, unspecified: Secondary | ICD-10-CM | POA: Diagnosis not present

## 2023-08-02 DIAGNOSIS — K219 Gastro-esophageal reflux disease without esophagitis: Secondary | ICD-10-CM

## 2023-08-02 DIAGNOSIS — I10 Essential (primary) hypertension: Secondary | ICD-10-CM | POA: Diagnosis not present

## 2023-08-02 DIAGNOSIS — Z0001 Encounter for general adult medical examination with abnormal findings: Secondary | ICD-10-CM | POA: Diagnosis not present

## 2023-08-02 DIAGNOSIS — Z0000 Encounter for general adult medical examination without abnormal findings: Secondary | ICD-10-CM

## 2023-08-02 DIAGNOSIS — Z125 Encounter for screening for malignant neoplasm of prostate: Secondary | ICD-10-CM

## 2023-08-02 LAB — URINALYSIS
Bilirubin, UA: NEGATIVE
Glucose, UA: NEGATIVE
Ketones, UA: NEGATIVE
Leukocytes,UA: NEGATIVE
Nitrite, UA: NEGATIVE
Protein,UA: NEGATIVE
RBC, UA: NEGATIVE
Specific Gravity, UA: 1.015 (ref 1.005–1.030)
Urobilinogen, Ur: 0.2 mg/dL (ref 0.2–1.0)
pH, UA: 7 (ref 5.0–7.5)

## 2023-08-02 MED ORDER — ALFUZOSIN HCL ER 10 MG PO TB24: 10.0000 mg | ORAL_TABLET | Freq: Every day | ORAL | 1 refills | Status: DC

## 2023-08-02 MED ORDER — EZETIMIBE 10 MG PO TABS: 10.0000 mg | ORAL_TABLET | Freq: Every day | ORAL | 3 refills | Status: AC

## 2023-08-02 MED ORDER — METOPROLOL SUCCINATE ER 100 MG PO TB24: ORAL_TABLET | ORAL | 3 refills | Status: DC

## 2023-08-02 MED ORDER — OMEPRAZOLE 40 MG PO CPDR: 40.0000 mg | DELAYED_RELEASE_CAPSULE | Freq: Every day | ORAL | 3 refills | Status: DC

## 2023-08-02 MED ORDER — LISINOPRIL 20 MG PO TABS: 20.0000 mg | ORAL_TABLET | Freq: Every day | ORAL | 3 refills | Status: DC

## 2023-08-02 MED ORDER — ACETAMINOPHEN 500 MG PO TABS: 1000.0000 mg | ORAL_TABLET | Freq: Three times a day (TID) | ORAL | 99 refills | Status: DC
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
## 2023-08-03 LAB — CMP14+EGFR
ALT: 19 [IU]/L (ref 0–44)
AST: 26 [IU]/L (ref 0–40)
Albumin: 4.9 g/dL (ref 3.9–4.9)
Alkaline Phosphatase: 107 [IU]/L (ref 44–121)
BUN/Creatinine Ratio: 14 (ref 10–24)
BUN: 11 mg/dL (ref 8–27)
Bilirubin Total: 1.3 mg/dL — ABNORMAL HIGH (ref 0.0–1.2)
CO2: 22 mmol/L (ref 20–29)
Calcium: 9.6 mg/dL (ref 8.6–10.2)
Chloride: 101 mmol/L (ref 96–106)
Creatinine, Ser: 0.76 mg/dL (ref 0.76–1.27)
Globulin, Total: 2.4 g/dL (ref 1.5–4.5)
Glucose: 93 mg/dL (ref 70–99)
Potassium: 4.5 mmol/L (ref 3.5–5.2)
Sodium: 140 mmol/L (ref 134–144)
Total Protein: 7.3 g/dL (ref 6.0–8.5)
eGFR: 102 mL/min/{1.73_m2} (ref >59)

## 2023-08-03 LAB — CBC WITH DIFFERENTIAL/PLATELET
Basophils Absolute: 0 10*3/uL (ref 0.0–0.2)
Basos: 1 %
EOS (ABSOLUTE): 0.2 10*3/uL (ref 0.0–0.4)
Eos: 5 %
Hematocrit: 45.4 % (ref 37.5–51.0)
Hemoglobin: 15.6 g/dL (ref 13.0–17.7)
Immature Grans (Abs): 0 10*3/uL (ref 0.0–0.1)
Immature Granulocytes: 0 %
Lymphocytes Absolute: 1.6 10*3/uL (ref 0.7–3.1)
Lymphs: 36 %
MCH: 31.3 pg (ref 26.6–33.0)
MCHC: 34.4 g/dL (ref 31.5–35.7)
MCV: 91 fL (ref 79–97)
Monocytes Absolute: 0.5 10*3/uL (ref 0.1–0.9)
Monocytes: 10 %
Neutrophils Absolute: 2.1 10*3/uL (ref 1.4–7.0)
Neutrophils: 48 %
Platelets: 234 10*3/uL (ref 150–450)
RBC: 4.98 x10E6/uL (ref 4.14–5.80)
RDW: 13.2 % (ref 11.6–15.4)
WBC: 4.4 10*3/uL (ref 3.4–10.8)

## 2023-08-03 LAB — LIPID PANEL
Chol/HDL Ratio: 4.8 ratio (ref 0.0–5.0)
Cholesterol, Total: 225 mg/dL — ABNORMAL HIGH (ref 100–199)
HDL: 47 mg/dL (ref >39)
LDL Chol Calc (NIH): 146 mg/dL — ABNORMAL HIGH (ref 0–99)
Triglycerides: 175 mg/dL — ABNORMAL HIGH (ref 0–149)
VLDL Cholesterol Cal: 32 mg/dL (ref 5–40)

## 2023-08-03 LAB — PSA, TOTAL AND FREE
PSA, Free Pct: 21.7 %
PSA, Free: 0.26 ng/mL
Prostate Specific Ag, Serum: 1.2 ng/mL (ref 0.0–4.0)

## 2023-08-03 LAB — VITAMIN D 25 HYDROXY (VIT D DEFICIENCY, FRACTURES): Vit D, 25-Hydroxy: 34.4 ng/mL (ref 30.0–100.0)

## 2024-01-13 ENCOUNTER — Other Ambulatory Visit: Payer: Self-pay | Admitting: Family Medicine

## 2024-01-14 ENCOUNTER — Ambulatory Visit (HOSPITAL_COMMUNITY): Payer: 59 | Attending: Cardiovascular Disease

## 2024-01-14 DIAGNOSIS — I10 Essential (primary) hypertension: Secondary | ICD-10-CM | POA: Insufficient documentation

## 2024-01-14 DIAGNOSIS — I359 Nonrheumatic aortic valve disorder, unspecified: Secondary | ICD-10-CM | POA: Diagnosis not present

## 2024-01-14 LAB — ECHOCARDIOGRAM COMPLETE
AV Mean grad: 13 mmHg
AV Peak grad: 24.6 mmHg
Ao pk vel: 2.48 m/s
Area-P 1/2: 3.22 cm2
S' Lateral: 2.6 cm

## 2024-01-22 ENCOUNTER — Ambulatory Visit: Payer: Self-pay

## 2024-01-22 ENCOUNTER — Telehealth: Payer: Self-pay | Admitting: Cardiovascular Disease

## 2024-01-22 NOTE — Telephone Encounter (Signed)
 Spouse is returning call to discuss echo results.

## 2024-01-22 NOTE — Telephone Encounter (Signed)
 Provided echo results and appointment info.

## 2024-01-30 ENCOUNTER — Ambulatory Visit (INDEPENDENT_AMBULATORY_CARE_PROVIDER_SITE_OTHER): Payer: 59 | Admitting: Family Medicine

## 2024-01-30 ENCOUNTER — Encounter: Payer: Self-pay | Admitting: Family Medicine

## 2024-01-30 VITALS — BP 147/85 | HR 77 | Temp 97.9°F | Ht 64.0 in

## 2024-01-30 DIAGNOSIS — I5032 Chronic diastolic (congestive) heart failure: Secondary | ICD-10-CM | POA: Diagnosis not present

## 2024-01-30 DIAGNOSIS — E782 Mixed hyperlipidemia: Secondary | ICD-10-CM | POA: Diagnosis not present

## 2024-01-30 DIAGNOSIS — I509 Heart failure, unspecified: Secondary | ICD-10-CM

## 2024-01-30 DIAGNOSIS — I10 Essential (primary) hypertension: Secondary | ICD-10-CM
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
## 2024-01-30 NOTE — Patient Instructions (Signed)

## 2024-01-31 LAB — CBC WITH DIFFERENTIAL/PLATELET
Basophils Absolute: 0 10*3/uL (ref 0.0–0.2)
Basos: 1 %
EOS (ABSOLUTE): 0.2 10*3/uL (ref 0.0–0.4)
Eos: 5 %
Hematocrit: 45.2 % (ref 37.5–51.0)
Hemoglobin: 15.1 g/dL (ref 13.0–17.7)
Immature Grans (Abs): 0 10*3/uL (ref 0.0–0.1)
Immature Granulocytes: 1 %
Lymphocytes Absolute: 1.9 10*3/uL (ref 0.7–3.1)
Lymphs: 40 %
MCH: 30.9 pg (ref 26.6–33.0)
MCHC: 33.4 g/dL (ref 31.5–35.7)
MCV: 93 fL (ref 79–97)
Monocytes Absolute: 0.5 10*3/uL (ref 0.1–0.9)
Monocytes: 9 %
Neutrophils Absolute: 2.2 10*3/uL (ref 1.4–7.0)
Neutrophils: 44 %
Platelets: 224 10*3/uL (ref 150–450)
RBC: 4.88 x10E6/uL (ref 4.14–5.80)
RDW: 13.2 % (ref 11.6–15.4)
WBC: 4.9 10*3/uL (ref 3.4–10.8)

## 2024-01-31 LAB — CMP14+EGFR
ALT: 21 IU/L (ref 0–44)
AST: 26 IU/L (ref 0–40)
Albumin: 4.8 g/dL (ref 3.9–4.9)
Alkaline Phosphatase: 105 IU/L (ref 44–121)
BUN/Creatinine Ratio: 15 (ref 10–24)
BUN: 10 mg/dL (ref 8–27)
Bilirubin Total: 1 mg/dL (ref 0.0–1.2)
CO2: 21 mmol/L (ref 20–29)
Calcium: 9.6 mg/dL (ref 8.6–10.2)
Chloride: 102 mmol/L (ref 96–106)
Creatinine, Ser: 0.68 mg/dL — ABNORMAL LOW (ref 0.76–1.27)
Globulin, Total: 2.3 g/dL (ref 1.5–4.5)
Glucose: 95 mg/dL (ref 70–99)
Potassium: 4.2 mmol/L (ref 3.5–5.2)
Sodium: 139 mmol/L (ref 134–144)
Total Protein: 7.1 g/dL (ref 6.0–8.5)
eGFR: 105 mL/min/{1.73_m2} (ref >59)

## 2024-01-31 LAB — LIPID PANEL
Chol/HDL Ratio: 4.4 ratio (ref 0.0–5.0)
Cholesterol, Total: 216 mg/dL — ABNORMAL HIGH (ref 100–199)
HDL: 49 mg/dL (ref >39)
LDL Chol Calc (NIH): 129 mg/dL — ABNORMAL HIGH (ref 0–99)
Triglycerides: 215 mg/dL — ABNORMAL HIGH (ref 0–149)
VLDL Cholesterol Cal: 38 mg/dL (ref 5–40)

## 2024-02-03 ENCOUNTER — Encounter: Payer: Self-pay | Admitting: Family Medicine

## 2024-02-05 ENCOUNTER — Ambulatory Visit: Payer: Self-pay | Admitting: Family Medicine

## 2024-03-31 ENCOUNTER — Ambulatory Visit: Attending: Cardiovascular Disease | Admitting: Cardiovascular Disease

## 2024-03-31 ENCOUNTER — Encounter: Payer: Self-pay | Admitting: Cardiovascular Disease

## 2024-03-31 VITALS — BP 119/81 | HR 58 | Ht 64.0 in | Wt 163.0 lb

## 2024-03-31 DIAGNOSIS — I359 Nonrheumatic aortic valve disorder, unspecified: Secondary | ICD-10-CM | POA: Diagnosis not present

## 2024-03-31 DIAGNOSIS — E782 Mixed hyperlipidemia: Secondary | ICD-10-CM | POA: Diagnosis not present

## 2024-03-31 DIAGNOSIS — I10 Essential (primary) hypertension: Secondary | ICD-10-CM | POA: Diagnosis not present

## 2024-03-31 NOTE — Progress Notes (Signed)
 Cardiology Office Note:    Date:  03/31/2024   ID:  [name], DOB 12/09/1960, MRN [id number]  PCP:  Zollie [place], MD   [facility] Providers Cardiologist:  Ozell Fell, MD     Referring MD: Zollie [place], MD   Chief Complaint  Patient presents with   Follow-up    Aortic valve disease (S/P AVR)    History of Present Illness:    [name] is a 63 y.o. male presenting for follow-up of aortic valve disease.  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  He has had routine surveillance of his aortic valve prosthesis with normal valve function noted. The patient's most recent echocardiogram Jan 14, 2024 shows an LVEF of 60 to 65%, grade 2 diastolic dysfunction, normal RV function, no significant mitral regurgitation or stenosis, mean transaortic gradient of 13 mmHg with no aortic insufficiency.  Mean gradient unchanged from previous echo study 1 year ago when the mean gradient was 15 mmHg.  The patient is here with his wife today.  He reports no change in symptoms.  He complains of sweating a lot and this has been longstanding.  There is no change in his symptoms.  No lightheadedness, presyncope, exertional chest pain or pressure, or shortness of breath.  Patient is compliant with his medications.  He takes SBE prophylaxis when indicated.  Current Medications: Current Meds  Medication Sig   acetaminophen  (TYLENOL ) 500 MG tablet Take 2 tablets (1,000 mg total) by mouth 3 (three) times daily.   amoxicillin  (AMOXIL ) 500 MG capsule Take 500 mg by mouth. 4 capsules by mouth one hour prior to dental procedure   aspirin  81 MG EC tablet Take 81 mg by mouth daily. Swallow whole.   B Complex-C (SUPER B COMPLEX PO) Take 1 capsule by mouth daily.   Coenzyme Q10 (CO Q-10) 100 MG CAPS Take by mouth.   cromolyn (OPTICROM) 4 % ophthalmic solution Place 1 drop into both eyes  2 (two) times daily.   ELDERBERRY PO Take by mouth.   Flaxseed, Linseed, (FLAX SEED OIL PO) Take 1 capsule by mouth daily.   Garlic Oil 1000 MG CAPS Take 1 capsule by mouth daily.   lisinopril  (ZESTRIL ) 20 MG tablet Take 1 tablet (20 mg total) by mouth daily.   metoprolol  succinate (TOPROL -XL) 100 MG 24 hr tablet TAKE 1 TABLET BY MOUTH EVERY MORNING with OR immediately following a meal.   MILK THISTLE PO Take 100 mg by mouth daily.   Multiple Vitamin (MULTIVITAMIN) tablet Take 1 tablet by mouth daily.   nitroGLYCERIN  (NITROSTAT ) 0.4 MG SL tablet DISSOLVE 1 TABLET UNDER THE TONGUE EVERY 5 MINUTES AS NEEDED FOR CHEST PAIN. DO NOT EXCEED A TOTAL OF 3 DOSES IN 15 MINUTES.   Omega-3 Fatty Acids (FISH OIL PO) Take 1 capsule by mouth 2 (two) times daily.   omeprazole  (PRILOSEC) 40 MG capsule Take 1 capsule (40 mg total) by mouth daily.   psyllium (METAMUCIL) 58.6 % powder Take 1 packet by mouth 2 (two) times daily.   RESTASIS  0.05 % ophthalmic emulsion 1 drop 2 (two) times daily.     Allergies:   Ibuprofen, Hydrocodone, Lyrica [pregabalin], Ambien  [zolpidem  tartrate], Atorvastatin , Clopidogrel , Cymbalta  [duloxetine  hcl], Meloxicam , Nsaids, Rosuvastatin , Tamsulosin , Acetaminophen , Citalopram, Gabapentin, and Statins   ROS:   Please see the history of present illness.    All other systems reviewed and are negative.  EKGs/Labs/Other Studies Reviewed:  The following studies were reviewed today: Cardiac Studies & Procedures   ______________________________________________________________________________________________   STRESS TESTS  ECHOCARDIOGRAM STRESS TEST 10/02/2017  Narrative *[name]* [address] [place], [place] [address] [phone]  ------------------------------------------------------------------- Stress Echocardiography  Patient:    [name] MR #:       [id number] Study Date: 10/02/2017 Gender:     M Age:        56 Height:     162.6  cm Weight:     75 kg BSA:        1.86 m^2 Pt. Status: Room:  SONOGRAPHER  [name], RDCS ATTENDING    Zollie [place] [address], [place] [address] [place] [phone] PERFORMING   [facility], Outpatient  cc:  -------------------------------------------------------------------  ------------------------------------------------------------------- Indications:      R07.9 Chest pain.  ------------------------------------------------------------------- History:   PMH:  Coronary Artery Disease. Ventricular Tachycardia. Risk factors:  Hypertension.  ------------------------------------------------------------------- Study Conclusions  - Stress ECG conclusions: There were no stress arrhythmias or conduction abnormalities. The stress ECG was normal. - Staged echo: There was no echocardiographic evidence for stress-induced ischemia. - Baseline: LV global systolic function was normal. The estimated LV ejection fraction was 65%. - Peak stress: LV global systolic function was hyperdynamic. The estimated LV ejection fraction was 75%. Normal stress ECHO.  Impressions:  - Normal study after maximal exercise. No adverse rhythms.  ------------------------------------------------------------------- Labs, prior tests, procedures, and surgery: Status post Aortic Valve Replacement.  ------------------------------------------------------------------- Study data:   Study status:  Routine.  Consent:  The risks, benefits, and alternatives to the procedure were explained to the patient and informed consent was obtained.  Procedure:  The patient reported no pain pre or post test. Initial setup. The patient was brought to the laboratory. A baseline ECG was recorded. Surface ECG leads and automatic cuff blood pressure measurements were monitored. Treadmill exercise testing was performed using the Bruce protocol. The patient exercised for 9 min, to protocol stage 3,  to a maximal work rate of 10.1 mets. Exercise was terminated due to achievement of target heart rate and patient request. The patient was positioned for image acquisition and recovery monitoring. Transthoracic stress echocardiography for chest pain evaluation. Images were captured at baseline and peak exercise.  Study completion:  The patient tolerated the procedure well. There were no complications.          Bruce protocol. Stress echocardiography. Birthdate:  Patient birthdate: 1960/10/14.  Age:  Patient is 63 yr old.  Sex:  Gender: male.    BMI: 28.4 kg/m^2.  Blood pressure: 156/109  Patient status:  Outpatient.  Study date:  Study date: 10/02/2017. Study time: 07:34 AM.  -------------------------------------------------------------------  ------------------------------------------------------------------- Baseline ECG:   Normal sinus rhythm with right bundle branch block.  ------------------------------------------------------------------- Stress protocol:  +---------------------+---+-------------+---------+ !Stage                !HR !BP (mmHg)    !Symptoms ! +---------------------+---+-------------+---------+ !Baseline             !82 !156/109 (125)!None     ! +---------------------+---+-------------+---------+ !Stage 1              !126!198/102 (134)!None     ! +---------------------+---+-------------+---------+ !Stage 2              !144!183/100 (128)!None     ! +---------------------+---+-------------+---------+ !Stage 3              !166!194/102 (133)!Fatigue  ! +---------------------+---+-------------+---------+ !Immediate  post [phone] [id number] !Subsiding! +---------------------+---+-------------+---------+ !Recovery; 1 min      !125!-------------!None     ! +---------------------+---+-------------+---------+ !Recovery; 2 min      !118!-------------!None     ! +---------------------+---+-------------+---------+ !Recovery; 3 min      !895!851/22 (101) !None      ! +---------------------+---+-------------+---------+ !Recovery; 4 min      !102!-------------!None     ! +---------------------+---+-------------+---------+ !Recovery; 5 min      !105!135/77 (96)  !None     ! +---------------------+---+-------------+---------+  ------------------------------------------------------------------- Stress results:   Maximal heart rate during stress was 166 bpm (101% of maximal predicted heart rate). The maximal predicted heart rate was 165 bpm.The target heart rate was achieved. The heart rate response to stress was normal. There was a normal resting blood pressure with an appropriate response to stress. The rate-pressure product for the peak heart rate and blood pressure was 67795 mm Hg/min.  The patient experienced no chest pain during stress.  ------------------------------------------------------------------- Stress ECG:  There were no stress arrhythmias or conduction abnormalities.  The stress ECG was normal.  ------------------------------------------------------------------- Baseline:  - LV size was normal. - LV global systolic function was normal. The estimated LV ejection fraction was 65%. - Normal wall motion; no LV regional wall motion abnormalities.  Peak stress:  - LV global systolic function was hyperdynamic. The estimated LV ejection fraction was 75%. - Normal wall motion; no LV regional wall motion abnormalities.  ------------------------------------------------------------------- Stress echo results:     Left ventricular ejection fraction was normal at rest and with stress. There was no echocardiographic evidence for stress-induced ischemia.  ------------------------------------------------------------------- Prepared and Electronically Authenticated by  Oneil Parchment, M.D. 2019-01-22T14:06:59   ECHOCARDIOGRAM  ECHOCARDIOGRAM COMPLETE 01/14/2024  Narrative ECHOCARDIOGRAM REPORT    Patient Name:   IDA CINDERELLA LAN  Date of Exam: 01/14/2024 Medical Rec #:  [id number]           Height:       64.0 in Accession #:    [id number]          Weight:       157.2 lb Date of Birth:  06-17-1961           BSA:          1.766 m Patient Age:    62 years            BP:           126/75 mmHg Patient Gender: M                   HR:           49 bpm. Exam Location:  [address]  Procedure: 2D Echo, Cardiac Doppler and Color Doppler (Both Spectral and Color Flow Doppler were utilized during procedure).  Indications:    I35.9 Aortic valve disorder  History:        Patient has prior history of Echocardiogram examinations, most recent 02/09/2023. AVR-2007., Arrythmias:Tachycardia; Risk Factors:Hypertension. History of endocarditis.  Sonographer:    Clayton Rodgers-Jones RDCS Referring Phys: [id number] Lashawna Poche  IMPRESSIONS   1. Left ventricular ejection fraction, by estimation, is 60 to 65%. The left ventricle has normal function. The left ventricle has no regional wall motion abnormalities. Left ventricular diastolic parameters are consistent with Grade II diastolic dysfunction (pseudonormalization). Elevated left ventricular end-diastolic pressure. 2. Right ventricular systolic function is normal. The right ventricular size is normal. There is normal pulmonary artery systolic pressure. 3. The mitral valve is normal in structure. No evidence of  mitral valve regurgitation. No evidence of mitral stenosis. 4. The aortic valve is tricuspid. There is mild calcification of the aortic valve. There is mild thickening of the aortic valve. Aortic valve regurgitation is not visualized. Mild aortic valve stenosis. 5. Aortic dilatation noted. There is mild dilatation of the aortic root, measuring 41 mm. 6. The inferior vena cava is normal in size with greater than 50% respiratory variability, suggesting right atrial pressure of 3 mmHg.  FINDINGS Left Ventricle: Left ventricular ejection fraction, by estimation, is 60 to 65%. The  left ventricle has normal function. The left ventricle has no regional wall motion abnormalities. The left ventricular internal cavity size was normal in size. There is no left ventricular hypertrophy. Left ventricular diastolic parameters are consistent with Grade II diastolic dysfunction (pseudonormalization). Elevated left ventricular end-diastolic pressure.  Right Ventricle: The right ventricular size is normal. No increase in right ventricular wall thickness. Right ventricular systolic function is normal. There is normal pulmonary artery systolic pressure. The tricuspid regurgitant velocity is 2.03 m/s, and with an assumed right atrial pressure of 3 mmHg, the estimated right ventricular systolic pressure is 19.5 mmHg.  Left Atrium: Left atrial size was normal in size.  Right Atrium: Right atrial size was normal in size.  Pericardium: There is no evidence of pericardial effusion.  Mitral Valve: The mitral valve is normal in structure. No evidence of mitral valve regurgitation. No evidence of mitral valve stenosis.  Tricuspid Valve: The tricuspid valve is normal in structure. Tricuspid valve regurgitation is trivial. No evidence of tricuspid stenosis.  The aortic valve is tricuspid. There is mild calcification of the aortic valve. There is mild thickening of the aortic valve. Aortic valve regurgitation is not visualized. Mild aortic stenosis is present. Pulmonic Valve: The pulmonic valve was normal in structure. Pulmonic valve regurgitation is not visualized. No evidence of pulmonic stenosis.  Aorta: Aortic dilatation noted. There is mild dilatation of the aortic root, measuring 41 mm.  Venous: The inferior vena cava is normal in size with greater than 50% respiratory variability, suggesting right atrial pressure of 3 mmHg.  IAS/Shunts: No atrial level shunt detected by color flow Doppler.   LEFT VENTRICLE PLAX 2D LVIDd:         5.30 cm Diastology LVIDs:         2.60 cm LV e' medial:     6.14 cm/s LV PW:         0.90 cm LV E/e' medial:  14.7 LV IVS:        0.90 cm LV e' lateral:   7.83 cm/s LV E/e' lateral: 11.5   RIGHT VENTRICLE             IVC RV Basal diam:  3.70 cm     IVC diam: 0.90 cm RV S prime:     10.50 cm/s TAPSE (M-mode): 1.5 cm  LEFT ATRIUM             Index        RIGHT ATRIUM           Index LA diam:        3.90 cm 2.21 cm/m   RA Area:     15.90 cm LA Vol (A2C):   50.9 ml 28.82 ml/m  RA Volume:   48.60 ml  27.52 ml/m LA Vol (A4C):   48.0 ml 27.18 ml/m LA Biplane Vol: 51.6 ml 29.22 ml/m AORTIC VALVE AV Vmax:           248.00 cm/s  AV Vmean:          141.600 cm/s AV VTI:            0.592 m AV Peak Grad:      24.6 mmHg AV Mean Grad:      13.0 mmHg LVOT Vmax:         85.15 cm/s LVOT Vmean:        54.750 cm/s LVOT VTI:          0.209 m LVOT/AV VTI ratio: 0.35  AORTA Ao Root diam: 4.10 cm Ao Asc diam:  3.80 cm  MITRAL VALVE               TRICUSPID VALVE MV Area (PHT): 3.22 cm    TR Peak grad:   16.5 mmHg MV Decel Time: 236 msec    TR Vmax:        203.00 cm/s MV E velocity: 90.10 cm/s MV A velocity: 84.10 cm/s  SHUNTS MV E/A ratio:  1.07        Systemic VTI: 0.21 m  Annabella Scarce MD Electronically signed by Annabella Scarce MD Signature Date/Time: 01/14/2024/2:54:58 PM    Final          ______________________________________________________________________________________________      EKG:   EKG Interpretation Date/Time:  Monday March 31 2024 10:03:14 EDT Ventricular Rate:  58 PR Interval:  176 QRS Duration:  148 QT Interval:  452 QTC Calculation: 443 R Axis:   -26  Text Interpretation: Sinus bradycardia Right bundle branch block When compared with ECG of 15-Apr-2015 05:32, T wave inversion now evident in Anterior leads Confirmed by Wonda Sharper [phone]) on 03/31/2024 10:24:51 AM    Recent Labs: 01/30/2024: ALT 21; BUN 10; Creatinine, Ser 0.68; Hemoglobin 15.1; Platelets 224; Potassium 4.2; Sodium 139  Recent Lipid  Panel    Component Value Date/Time   CHOL 216 (H) 01/30/2024 0858   TRIG 215 (H) 01/30/2024 0858   HDL 49 01/30/2024 0858   CHOLHDL 4.4 01/30/2024 0858   CHOLHDL 4.4 04/15/2015 0107   VLDL 19 04/15/2015 0107   LDLCALC 129 (H) 01/30/2024 0858     Risk Assessment/Calculations:                Physical Exam:    VS:  BP 119/81   Pulse (!) 58   Ht 5' 4 (1.626 m)   Wt 163 lb (73.9 kg)   SpO2 96%   BMI 27.98 kg/m     Wt Readings from Last 3 Encounters:  03/31/24 163 lb (73.9 kg)  08/02/23 157 lb 3.2 oz (71.3 kg)  03/13/23 161 lb 12.8 oz (73.4 kg)     GEN:  Well nourished, well developed in no acute distress HEENT: Normal NECK: No JVD; No carotid bruits LYMPHATICS: No lymphadenopathy CARDIAC: RRR, 2/6 early peaking ejection murmur at the right upper sternal border RESPIRATORY:  Clear to auscultation without rales, wheezing or rhonchi  ABDOMEN: Soft, non-tender, non-distended MUSCULOSKELETAL:  No edema; No deformity  SKIN: Warm and dry NEUROLOGIC:  Alert and oriented x 3 PSYCHIATRIC:  Normal affect   Assessment & Plan Essential hypertension Blood pressure well-controlled on lisinopril  and metoprolol  succinate.  Will continue the same.  Tolerating his medical regimen well.  Recent creatinine is 0.68 and potassium is 4.2. Aortic valve disorder I reviewed the patient's echo study and it demonstrates stable/normal transvalvular gradients with his aortic bioprosthesis.  Patient is 17 years out from bioprosthetic aortic valve replacement.  He will continue with annual echo studies  per guideline recommendations.  SBE prophylaxis is followed when indicated with amoxicillin  2 g. Mixed hyperlipidemia Patient is intolerant to statins.  Recently tried on ezetimibe  and had multiple side effects.  He will continue with lifestyle modification and dietary changes.  His cholesterol is 216, LDL 129, HDL 49.            Medication Adjustments/Labs and Tests Ordered: Current  medicines are reviewed at length with the patient today.  Concerns regarding medicines are outlined above.  Orders Placed This Encounter  Procedures   EKG 12-Lead   ECHOCARDIOGRAM COMPLETE   No orders of the defined types were placed in this encounter.   Patient Instructions  Medication Instructions:  Your physician recommends that you continue on your current medications as directed. Please refer to the Current Medication list given to you today.  *If you need a refill on your cardiac medications before your next appointment, please call your pharmacy*  Lab Work: None ordered  If you have labs (blood work) drawn today and your tests are completely normal, you will receive your results only by: MyChart Message (if you have MyChart) OR A paper copy in the mail If you have any lab test that is abnormal or we need to change your treatment, we will call you to review the results.  Testing/Procedures: Your physician has requested that you have an echocardiogram in 1 YEAR. Echocardiography is a painless test that uses sound waves to create images of your heart. It provides your doctor with information about the size and shape of your heart and how well your heart's chambers and valves are working. This procedure takes approximately one hour. There are no restrictions for this procedure. Please do NOT wear cologne, perfume, aftershave, or lotions (deodorant is allowed). Please arrive 15 minutes prior to your appointment time.  Please note: We ask at that you not bring children with you during ultrasound (echo/ vascular) testing. Due to room size and safety concerns, children are not allowed in the ultrasound rooms during exams. Our front office staff cannot provide observation of children in our lobby area while testing is being conducted. An adult accompanying a patient to their appointment will only be allowed in the ultrasound room at the discretion of the ultrasound technician under special  circumstances. We apologize for any inconvenience.   Follow-Up: At [facility], you and your health needs are our priority.  As part of our continuing mission to provide you with exceptional heart care, our providers are all part of one team.  This team includes your primary Cardiologist (physician) and Advanced Practice Providers or APPs (Physician Assistants and Nurse Practitioners) who all work together to provide you with the care you need, when you need it.  Your next appointment:   12 month(s)  Provider:   Ozell Fell, MD    We recommend signing up for the patient portal called MyChart.  Sign up information is provided on this After Visit Summary.  MyChart is used to connect with patients for Virtual Visits (Telemedicine).  Patients are able to view lab/test results, encounter notes, upcoming appointments, etc.  Non-urgent messages can be sent to your provider as well.   To learn more about what you can do with MyChart, go to [email or web address].   Other Instructions        Signed, Ozell Fell, MD  03/31/2024 11:27 AM    [facility]

## 2024-03-31 NOTE — Assessment & Plan Note (Signed)
 Patient is intolerant to statins.  Recently tried on ezetimibe  and had multiple side effects.  He will continue with lifestyle modification and dietary changes.  His cholesterol is 216, LDL 129, HDL 49.

## 2024-03-31 NOTE — Patient Instructions (Addendum)
 Medication Instructions:  Your physician recommends that you continue on your current medications as directed. Please refer to the Current Medication list given to you today.  *If you need a refill on your cardiac medications before your next appointment, please call your pharmacy*  Lab Work: None ordered  If you have labs (blood work) drawn today and your tests are completely normal, you will receive your results only by: MyChart Message (if you have MyChart) OR A paper copy in the mail If you have any lab test that is abnormal or we need to change your treatment, we will call you to review the results.  Testing/Procedures: Your physician has requested that you have an echocardiogram in 1 YEAR. Echocardiography is a painless test that uses sound waves to create images of your heart. It provides your doctor with information about the size and shape of your heart and how well your heart's chambers and valves are working. This procedure takes approximately one hour. There are no restrictions for this procedure. Please do NOT wear cologne, perfume, aftershave, or lotions (deodorant is allowed). Please arrive 15 minutes prior to your appointment time.  Please note: We ask at that you not bring children with you during ultrasound (echo/ vascular) testing. Due to room size and safety concerns, children are not allowed in the ultrasound rooms during exams. Our front office staff cannot provide observation of children in our lobby area while testing is being conducted. An adult accompanying a patient to their appointment will only be allowed in the ultrasound room at the discretion of the ultrasound technician under special circumstances. We apologize for any inconvenience.   Follow-Up: At [facility], you and your health needs are our priority.  As part of our continuing mission to provide you with exceptional heart care, our providers are all part of one team.  This team includes your  primary Cardiologist (physician) and Advanced Practice Providers or APPs (Physician Assistants and Nurse Practitioners) who all work together to provide you with the care you need, when you need it.  Your next appointment:   12 month(s)  Provider:   Ozell Fell, MD    We recommend signing up for the patient portal called MyChart.  Sign up information is provided on this After Visit Summary.  MyChart is used to connect with patients for Virtual Visits (Telemedicine).  Patients are able to view lab/test results, encounter notes, upcoming appointments, etc.  Non-urgent messages can be sent to your provider as well.   To learn more about what you can do with MyChart, go to [email or web address].   Other Instructions

## 2024-06-25 ENCOUNTER — Other Ambulatory Visit: Payer: Self-pay | Admitting: Cardiovascular Disease

## 2024-07-12 ENCOUNTER — Other Ambulatory Visit: Payer: Self-pay | Admitting: *Deleted

## 2024-07-12 DIAGNOSIS — I10 Essential (primary) hypertension: Secondary | ICD-10-CM

## 2024-07-31 ENCOUNTER — Encounter: Payer: Self-pay | Admitting: Family Medicine

## 2024-07-31 ENCOUNTER — Ambulatory Visit: Payer: Self-pay | Admitting: Family Medicine

## 2024-07-31 VITALS — BP 135/82 | HR 46 | Temp 97.8°F | Ht <= 58 in | Wt 166.0 lb

## 2024-07-31 DIAGNOSIS — E782 Mixed hyperlipidemia: Secondary | ICD-10-CM | POA: Diagnosis not present

## 2024-07-31 DIAGNOSIS — I10 Essential (primary) hypertension: Secondary | ICD-10-CM

## 2024-07-31 DIAGNOSIS — E559 Vitamin D deficiency, unspecified: Secondary | ICD-10-CM

## 2024-07-31 DIAGNOSIS — K219 Gastro-esophageal reflux disease without esophagitis: Secondary | ICD-10-CM | POA: Diagnosis not present

## 2024-07-31 DIAGNOSIS — Z952 Presence of prosthetic heart valve: Secondary | ICD-10-CM | POA: Diagnosis not present

## 2024-07-31 DIAGNOSIS — Z125 Encounter for screening for malignant neoplasm of prostate: Secondary | ICD-10-CM

## 2024-07-31 LAB — LIPID PANEL

## 2024-07-31 MED ORDER — OMEPRAZOLE 40 MG PO CPDR: 40.0000 mg | DELAYED_RELEASE_CAPSULE | Freq: Every day | ORAL | 3 refills | Status: AC

## 2024-07-31 MED ORDER — ALFUZOSIN HCL ER 10 MG PO TB24
10.0000 mg | ORAL_TABLET | Freq: Every day | ORAL | 1 refills | Status: AC
Start: 2024-07-31 — End: ?

## 2024-07-31 MED ORDER — ACETAMINOPHEN 500 MG PO TABS: 1000.0000 mg | ORAL_TABLET | Freq: Three times a day (TID) | ORAL | 99 refills | Status: AC
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
## 2024-08-01 LAB — CBC WITH DIFFERENTIAL/PLATELET
Basophils Absolute: 0 x10E3/uL (ref 0.0–0.2)
Basos: 1 %
EOS (ABSOLUTE): 0.4 x10E3/uL (ref 0.0–0.4)
Eos: 6 %
Hematocrit: 44.7 % (ref 37.5–51.0)
Hemoglobin: 15.3 g/dL (ref 13.0–17.7)
Immature Grans (Abs): 0 x10E3/uL (ref 0.0–0.1)
Immature Granulocytes: 0 %
Lymphocytes Absolute: 2.2 x10E3/uL (ref 0.7–3.1)
Lymphs: 40 %
MCH: 31.7 pg (ref 26.6–33.0)
MCHC: 34.2 g/dL (ref 31.5–35.7)
MCV: 93 fL (ref 79–97)
Monocytes Absolute: 0.6 x10E3/uL (ref 0.1–0.9)
Monocytes: 11 %
Neutrophils Absolute: 2.3 x10E3/uL (ref 1.4–7.0)
Neutrophils: 41 %
Platelets: 203 x10E3/uL (ref 150–450)
RBC: 4.82 x10E6/uL (ref 4.14–5.80)
RDW: 13.2 % (ref 11.6–15.4)
WBC: 5.5 x10E3/uL (ref 3.4–10.8)

## 2024-08-01 LAB — CMP14+EGFR
ALT: 26 IU/L (ref 0–44)
AST: 30 IU/L (ref 0–40)
Albumin: 4.8 g/dL (ref 3.9–4.9)
Alkaline Phosphatase: 86 IU/L (ref 47–123)
BUN/Creatinine Ratio: 16 (ref 10–24)
BUN: 13 mg/dL (ref 8–27)
Bilirubin Total: 1.1 mg/dL (ref 0.0–1.2)
CO2: 24 mmol/L (ref 20–29)
Calcium: 9.7 mg/dL (ref 8.6–10.2)
Chloride: 104 mmol/L (ref 96–106)
Creatinine, Ser: 0.8 mg/dL (ref 0.76–1.27)
Globulin, Total: 2.5 g/dL (ref 1.5–4.5)
Glucose: 95 mg/dL (ref 70–99)
Potassium: 4.1 mmol/L (ref 3.5–5.2)
Sodium: 141 mmol/L (ref 134–144)
Total Protein: 7.3 g/dL (ref 6.0–8.5)
eGFR: 100 mL/min/1.73 (ref >59)

## 2024-08-01 LAB — LIPID PANEL
Chol/HDL Ratio: 4.4 ratio (ref 0.0–5.0)
Cholesterol, Total: 235 mg/dL — ABNORMAL HIGH (ref 100–199)
HDL: 53 mg/dL (ref >39)
LDL Chol Calc (NIH): 153 mg/dL — ABNORMAL HIGH (ref 0–99)
Triglycerides: 162 mg/dL — ABNORMAL HIGH (ref 0–149)
VLDL Cholesterol Cal: 29 mg/dL (ref 5–40)

## 2024-08-01 LAB — PSA, TOTAL AND FREE
PSA, Free Pct: 23 %
PSA, Free: 0.23 ng/mL
Prostate Specific Ag, Serum: 1 ng/mL (ref 0.0–4.0)

## 2024-08-01 LAB — VITAMIN D 25 HYDROXY (VIT D DEFICIENCY, FRACTURES): Vit D, 25-Hydroxy: 33.3 ng/mL (ref 30.0–100.0)

## 2024-08-03 ENCOUNTER — Ambulatory Visit: Payer: Self-pay | Admitting: Family Medicine

## 2024-08-03 ENCOUNTER — Encounter: Payer: Self-pay | Admitting: Family Medicine

## 2024-10-15 ENCOUNTER — Other Ambulatory Visit: Payer: Self-pay | Admitting: Family Medicine

## 2024-10-15 DIAGNOSIS — I10 Essential (primary) hypertension: Secondary | ICD-10-CM

## 2024-10-16 NOTE — Telephone Encounter (Signed)
"  SCHEDULED APPT  "

## 2024-10-16 NOTE — Telephone Encounter (Signed)
 Stacks NTBS in May for 6 mos FU RFs sent to pharmacy

## 2025-01-13 ENCOUNTER — Ambulatory Visit: Admitting: Family Medicine
